# Patient Record
Sex: Female | Born: 1975
Health system: Southern US, Community
[De-identification: ages and names within clinical notes are randomized; demographics above are authoritative.]

## PROBLEM LIST (undated history)

## (undated) DIAGNOSIS — B373 Candidiasis of vulva and vagina: Secondary | ICD-10-CM

## (undated) DIAGNOSIS — N760 Acute vaginitis: Secondary | ICD-10-CM

## (undated) DIAGNOSIS — J302 Other seasonal allergic rhinitis: Secondary | ICD-10-CM

## (undated) DIAGNOSIS — A599 Trichomoniasis, unspecified: Secondary | ICD-10-CM

## (undated) DIAGNOSIS — B3731 Acute candidiasis of vulva and vagina: Secondary | ICD-10-CM

## (undated) DIAGNOSIS — R87810 Cervical high risk human papillomavirus (HPV) DNA test positive: Secondary | ICD-10-CM

## (undated) DIAGNOSIS — L709 Acne, unspecified: Secondary | ICD-10-CM

## (undated) DIAGNOSIS — L659 Nonscarring hair loss, unspecified: Secondary | ICD-10-CM

## (undated) DIAGNOSIS — B9689 Other specified bacterial agents as the cause of diseases classified elsewhere: Secondary | ICD-10-CM

## (undated) DIAGNOSIS — A6000 Herpesviral infection of urogenital system, unspecified: Secondary | ICD-10-CM

## (undated) DIAGNOSIS — Z9289 Personal history of other medical treatment: Secondary | ICD-10-CM

## (undated) HISTORY — DX: Acute candidiasis of vulva and vagina: B37.31

## (undated) HISTORY — DX: Other specified bacterial agents as the cause of diseases classified elsewhere: N76.0

## (undated) HISTORY — DX: Herpesviral infection of urogenital system, unspecified: A60.00

## (undated) HISTORY — DX: Other specified bacterial agents as the cause of diseases classified elsewhere: B96.89

## (undated) HISTORY — DX: Acne, unspecified: L70.9

## (undated) HISTORY — DX: Nonscarring hair loss, unspecified: L65.9

## (undated) HISTORY — DX: Trichomoniasis, unspecified: A59.9

## (undated) HISTORY — DX: Other seasonal allergic rhinitis: J30.2

## (undated) HISTORY — DX: Candidiasis of vulva and vagina: B37.3

## (undated) HISTORY — DX: Personal history of other medical treatment: Z92.89

## (undated) HISTORY — DX: Cervical high risk human papillomavirus (HPV) DNA test positive: R87.810

---

## 2003-03-22 ENCOUNTER — Other Ambulatory Visit: Admission: RE | Admit: 2003-03-22 | Discharge: 2003-03-22 | Payer: Self-pay | Admitting: Obstetrics and Gynecology

## 2003-07-09 ENCOUNTER — Other Ambulatory Visit: Admission: RE | Admit: 2003-07-09 | Discharge: 2003-07-09 | Payer: Self-pay | Admitting: Obstetrics and Gynecology

## 2003-09-26 ENCOUNTER — Inpatient Hospital Stay (HOSPITAL_COMMUNITY): Admission: AD | Admit: 2003-09-26 | Discharge: 2003-09-26 | Payer: Self-pay | Admitting: Pediatrics

## 2003-10-10 ENCOUNTER — Inpatient Hospital Stay (HOSPITAL_COMMUNITY): Admission: AD | Admit: 2003-10-10 | Discharge: 2003-10-14 | Payer: Self-pay | Admitting: Obstetrics and Gynecology

## 2003-11-28 ENCOUNTER — Other Ambulatory Visit: Admission: RE | Admit: 2003-11-28 | Discharge: 2003-11-28 | Payer: Self-pay | Admitting: Obstetrics and Gynecology

## 2007-07-01 ENCOUNTER — Emergency Department: Payer: Self-pay | Admitting: Emergency Medicine

## 2010-10-27 DIAGNOSIS — Z9289 Personal history of other medical treatment: Secondary | ICD-10-CM

## 2010-10-27 HISTORY — DX: Personal history of other medical treatment: Z92.89

## 2013-12-21 LAB — HM PAP SMEAR: HM PAP: NEGATIVE

## 2014-02-24 ENCOUNTER — Ambulatory Visit: Payer: Self-pay | Admitting: Family Medicine

## 2014-02-24 LAB — URINALYSIS, COMPLETE
BACTERIA: NEGATIVE
BILIRUBIN, UR: NEGATIVE
GLUCOSE, UR: NEGATIVE
Ketone: NEGATIVE
Leukocyte Esterase: NEGATIVE
Nitrite: NEGATIVE
PH: 7.5 (ref 5.0–8.0)
Protein: NEGATIVE
Specific Gravity: 1.015 (ref 1.000–1.030)
WBC UR: NONE SEEN /HPF (ref 0–5)

## 2014-02-24 LAB — PREGNANCY, URINE: Pregnancy Test, Urine: NEGATIVE m[IU]/mL

## 2015-03-13 DIAGNOSIS — M7522 Bicipital tendinitis, left shoulder: Secondary | ICD-10-CM | POA: Insufficient documentation

## 2015-03-13 DIAGNOSIS — M7521 Bicipital tendinitis, right shoulder: Secondary | ICD-10-CM | POA: Insufficient documentation

## 2015-03-13 DIAGNOSIS — M7581 Other shoulder lesions, right shoulder: Secondary | ICD-10-CM | POA: Insufficient documentation

## 2015-08-21 DIAGNOSIS — M7551 Bursitis of right shoulder: Secondary | ICD-10-CM | POA: Diagnosis not present

## 2015-08-21 DIAGNOSIS — M7521 Bicipital tendinitis, right shoulder: Secondary | ICD-10-CM | POA: Diagnosis not present

## 2015-08-21 DIAGNOSIS — M7582 Other shoulder lesions, left shoulder: Secondary | ICD-10-CM | POA: Diagnosis not present

## 2015-08-21 DIAGNOSIS — M7581 Other shoulder lesions, right shoulder: Secondary | ICD-10-CM | POA: Diagnosis not present

## 2015-09-11 DIAGNOSIS — A6000 Herpesviral infection of urogenital system, unspecified: Secondary | ICD-10-CM | POA: Diagnosis not present

## 2015-09-11 DIAGNOSIS — B9689 Other specified bacterial agents as the cause of diseases classified elsewhere: Secondary | ICD-10-CM | POA: Diagnosis not present

## 2015-09-11 DIAGNOSIS — N760 Acute vaginitis: Secondary | ICD-10-CM | POA: Diagnosis not present

## 2016-03-02 DIAGNOSIS — R87810 Cervical high risk human papillomavirus (HPV) DNA test positive: Secondary | ICD-10-CM

## 2016-03-02 HISTORY — DX: Cervical high risk human papillomavirus (HPV) DNA test positive: R87.810

## 2016-07-08 ENCOUNTER — Encounter: Payer: Self-pay | Admitting: Obstetrics and Gynecology

## 2016-07-08 ENCOUNTER — Ambulatory Visit (INDEPENDENT_AMBULATORY_CARE_PROVIDER_SITE_OTHER): Payer: BLUE CROSS/BLUE SHIELD | Admitting: Obstetrics and Gynecology

## 2016-07-08 VITALS — BP 110/72 | Ht 66.5 in | Wt 188.0 lb

## 2016-07-08 DIAGNOSIS — Z113 Encounter for screening for infections with a predominantly sexual mode of transmission: Secondary | ICD-10-CM | POA: Diagnosis not present

## 2016-07-08 DIAGNOSIS — Z0000 Encounter for general adult medical examination without abnormal findings: Secondary | ICD-10-CM

## 2016-07-08 DIAGNOSIS — Z1321 Encounter for screening for nutritional disorder: Secondary | ICD-10-CM

## 2016-07-08 DIAGNOSIS — Z13220 Encounter for screening for lipoid disorders: Secondary | ICD-10-CM

## 2016-07-08 DIAGNOSIS — A6004 Herpesviral vulvovaginitis: Secondary | ICD-10-CM | POA: Diagnosis not present

## 2016-07-08 DIAGNOSIS — L659 Nonscarring hair loss, unspecified: Secondary | ICD-10-CM | POA: Diagnosis not present

## 2016-07-08 MED ORDER — VALACYCLOVIR HCL 500 MG PO TABS: 500.0000 mg | ORAL_TABLET | Freq: Two times a day (BID) | ORAL | 0 refills | Status: DC

## 2016-07-08 NOTE — Progress Notes (Addendum)
Chief Complaint  Patient presents with  . Follow-up    Medication Follow Up    HPI:      [name] is a 41 y.o. G3P1011 who LMP was Patient's last menstrual period was 06/12/2016., presents today for full STD testing. She denies any known exposures or sx, but is concerned about being tested. She denies any vaginal sx, d/c, irritation, odor. She has a new sex partner. She would like a gon/chlam swab of her throat too. She has had a recent sore throat with d/c and cough.  She would also like labs for her hair loss disorder. 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 She needs a RF on her valtrex for prn outbreaks.  She is past due for her annul and has it sched 6/18.    Patient Active Problem List   Diagnosis Date Noted  . Genital herpes     Family History  Problem Relation Age of Onset  . Hypertension Mother   . Colon polyps Father   . Liver disease Maternal Grandmother   . Bone cancer Paternal Grandmother   . Lung cancer Paternal Grandfather   . Breast cancer Maternal Aunt     Social History   Social History  . Marital status: Single    Spouse name: N/A  . Number of children: N/A  . Years of education: N/A   Occupational History  . Not on file.   Social History Main Topics  . Smoking status: Never Smoker  . Smokeless tobacco: Never Used  . Alcohol use No  . Drug use: No  . Sexual activity: Not on file   Other Topics Concern  . Not on file   Social History Narrative  . No narrative on file     Current Outpatient Prescriptions:  .  valACYclovir (VALTREX) 500 MG tablet, Take 1 tablet (500 mg total) by mouth 2 (two) times daily., Disp: 30 tablet, Rfl: 0  Review of Systems  Constitutional: Negative for fever.  Gastrointestinal: Negative for blood in stool, constipation, diarrhea, nausea and vomiting.  Genitourinary: Negative for dyspareunia, dysuria, flank pain, frequency, hematuria, urgency, vaginal bleeding, vaginal  discharge and vaginal pain.  Musculoskeletal: Negative for back pain.  Skin: Negative for rash.     OBJECTIVE:   Vitals:  BP 110/72   Ht 5' 6.5" (1.689 m)   Wt 188 lb (85.3 kg)   LMP 06/12/2016   BMI 29.89 kg/m   Physical Exam  Constitutional: She is oriented to person, place, and time and well-developed, well-nourished, and in no distress. Vital signs are normal.  Genitourinary: Vagina normal, uterus normal, cervix normal, right adnexa normal, left adnexa normal and vulva normal. Uterus is not enlarged. Cervix exhibits no motion tenderness and no tenderness. Right adnexum displays no mass and no tenderness. Left adnexum displays no mass and no tenderness. Vulva exhibits no erythema, no exudate, no lesion, no rash and no tenderness. Vagina exhibits no lesion.  Neurological: She is oriented to person, place, and time.  Vitals reviewed.    Assessment/Plan: Screening for STD (sexually transmitted disease) - Full testing per pt request. - Plan: Chlamydia/Gonococcus/Trichomonas, NAA, HIV antibody, RPR, Hepatitis C antibody  Hair loss disorder - Chck yearly labs due to hair transplant a couple yrs ago.  - Plan: TSH + free T4, Testosterone,Free and Total, Ferritin, DHEA, Comprehensive metabolic panel, CANCELED: Comprehensive metabolic panel, CANCELED: Testosterone,Free and Total, CANCELED: Ferritin, CANCELED: TSH + free T4, CANCELED: DHEA  Herpes  simplex vulvovaginitis - Rx RF valtrex eRxd. Pt takes prn sx. - Plan: valACYclovir (VALTREX) 500 MG tablet  Screening cholesterol level - Plan: Lipid panel  Encounter for vitamin deficiency screening - Plan: VITAMIN D 25 Hydroxy (Vit-D Deficiency, Fractures)  Blood tests for routine general physical examination - Plan: TSH + free T4, Comprehensive metabolic panel, Lipid panel, VITAMIN D 25 Hydroxy (Vit-D Deficiency, Fractures)  We don't have test kit for throat gon/chlam swab.    Return if symptoms worsen or fail to improve.  Kyian Obst B.  Nala Kachel, PA-C 07/09/2016 10:21 AM  07/09/16 ADDENDUM: Pt brought throat STD testing kit with her (from her clinic). Pharyngeal swab done for gon/chlam testing. Will f/u with results.  Ekam Besson, PA-C 9/53/20 2:14PM

## 2016-07-09 ENCOUNTER — Other Ambulatory Visit: Payer: Self-pay | Admitting: Obstetrics and Gynecology

## 2016-07-09 ENCOUNTER — Other Ambulatory Visit: Payer: BLUE CROSS/BLUE SHIELD

## 2016-07-09 ENCOUNTER — Other Ambulatory Visit: Payer: Self-pay

## 2016-07-09 DIAGNOSIS — Z113 Encounter for screening for infections with a predominantly sexual mode of transmission: Secondary | ICD-10-CM | POA: Diagnosis not present

## 2016-07-09 DIAGNOSIS — Z0000 Encounter for general adult medical examination without abnormal findings: Secondary | ICD-10-CM | POA: Diagnosis not present

## 2016-07-09 DIAGNOSIS — L659 Nonscarring hair loss, unspecified: Secondary | ICD-10-CM | POA: Diagnosis not present

## 2016-07-09 DIAGNOSIS — Z13220 Encounter for screening for lipoid disorders: Secondary | ICD-10-CM | POA: Diagnosis not present

## 2016-07-09 DIAGNOSIS — Z1321 Encounter for screening for nutritional disorder: Secondary | ICD-10-CM

## 2016-07-09 DIAGNOSIS — J029 Acute pharyngitis, unspecified: Secondary | ICD-10-CM | POA: Diagnosis not present

## 2016-07-09 DIAGNOSIS — A6000 Herpesviral infection of urogenital system, unspecified: Secondary | ICD-10-CM | POA: Insufficient documentation

## 2016-07-11 LAB — RPR: RPR: NONREACTIVE

## 2016-07-11 LAB — COMPREHENSIVE METABOLIC PANEL
A/G RATIO: 1.5 (ref 1.2–2.2)
ALK PHOS: 43 IU/L (ref 39–117)
ALT: 18 IU/L (ref 0–32)
AST: 20 IU/L (ref 0–40)
Albumin: 4.5 g/dL (ref 3.5–5.5)
BUN / CREAT RATIO: 12 (ref 9–23)
BUN: 13 mg/dL (ref 6–24)
Bilirubin Total: 0.5 mg/dL (ref 0.0–1.2)
CALCIUM: 9.5 mg/dL (ref 8.7–10.2)
CO2: 21 mmol/L (ref 18–29)
Chloride: 100 mmol/L (ref 96–106)
Creatinine, Ser: 1.09 mg/dL — ABNORMAL HIGH (ref 0.57–1.00)
GFR calc Af Amer: 73 mL/min/{1.73_m2} (ref >59)
GFR, EST NON AFRICAN AMERICAN: 63 mL/min/{1.73_m2} (ref >59)
GLOBULIN, TOTAL: 3 g/dL (ref 1.5–4.5)
Glucose: 70 mg/dL (ref 65–99)
POTASSIUM: 4.2 mmol/L (ref 3.5–5.2)
SODIUM: 136 mmol/L (ref 134–144)
Total Protein: 7.5 g/dL (ref 6.0–8.5)

## 2016-07-11 LAB — CHLAMYDIA/GONOCOCCUS/TRICHOMONAS, NAA
Chlamydia by NAA: NEGATIVE
Gonococcus by NAA: NEGATIVE
TRICH VAG BY NAA: POSITIVE — AB

## 2016-07-11 LAB — TSH+FREE T4
FREE T4: 1.66 ng/dL (ref 0.82–1.77)
TSH: 1.04 u[IU]/mL (ref 0.450–4.500)

## 2016-07-11 LAB — VITAMIN D 25 HYDROXY (VIT D DEFICIENCY, FRACTURES): Vit D, 25-Hydroxy: 30.3 ng/mL (ref 30.0–100.0)

## 2016-07-11 LAB — DHEA: Dehydroepiandrosterone: 351 ng/dL (ref 31–701)

## 2016-07-11 LAB — LIPID PANEL
CHOL/HDL RATIO: 2.1 ratio (ref 0.0–4.4)
Cholesterol, Total: 154 mg/dL (ref 100–199)
HDL: 75 mg/dL (ref >39)
LDL Calculated: 71 mg/dL (ref 0–99)
TRIGLYCERIDES: 39 mg/dL (ref 0–149)
VLDL Cholesterol Cal: 8 mg/dL (ref 5–40)

## 2016-07-11 LAB — FERRITIN: FERRITIN: 16 ng/mL (ref 15–150)

## 2016-07-11 LAB — TESTOSTERONE,FREE AND TOTAL
TESTOSTERONE: 21 ng/dL (ref 8–48)
Testosterone, Free: 2 pg/mL (ref 0.0–4.2)

## 2016-07-11 LAB — HIV ANTIBODY (ROUTINE TESTING W REFLEX): HIV SCREEN 4TH GENERATION: NONREACTIVE

## 2016-07-11 LAB — HEPATITIS C ANTIBODY

## 2016-07-13 ENCOUNTER — Other Ambulatory Visit: Payer: Self-pay | Admitting: Obstetrics and Gynecology

## 2016-07-13 DIAGNOSIS — A599 Trichomoniasis, unspecified: Secondary | ICD-10-CM

## 2016-07-13 LAB — GC/CHLAMYDIA PROBE AMP
Chlamydia trachomatis, NAA: NEGATIVE
NEISSERIA GONORRHOEAE BY PCR: NEGATIVE

## 2016-07-13 MED ORDER — METRONIDAZOLE 500 MG PO TABS
500.0000 mg | ORAL_TABLET | Freq: Two times a day (BID) | ORAL | 0 refills | Status: AC
Start: 2016-07-13 — End: 2016-07-14

## 2016-07-13 NOTE — Progress Notes (Signed)
Pt aware of trich on nuswab. Rx flagyl eRxd. Partner needs tx. F/u prn.

## 2016-08-19 ENCOUNTER — Encounter: Payer: Self-pay | Admitting: Obstetrics and Gynecology

## 2016-08-19 ENCOUNTER — Ambulatory Visit (INDEPENDENT_AMBULATORY_CARE_PROVIDER_SITE_OTHER): Payer: BLUE CROSS/BLUE SHIELD | Admitting: Obstetrics and Gynecology

## 2016-08-19 VITALS — BP 122/68 | HR 84 | Ht 66.0 in | Wt 182.0 lb

## 2016-08-19 DIAGNOSIS — A6004 Herpesviral vulvovaginitis: Secondary | ICD-10-CM

## 2016-08-19 DIAGNOSIS — Z124 Encounter for screening for malignant neoplasm of cervix: Secondary | ICD-10-CM | POA: Diagnosis not present

## 2016-08-19 DIAGNOSIS — Z01419 Encounter for gynecological examination (general) (routine) without abnormal findings: Secondary | ICD-10-CM

## 2016-08-19 DIAGNOSIS — Z1151 Encounter for screening for human papillomavirus (HPV): Secondary | ICD-10-CM | POA: Diagnosis not present

## 2016-08-19 DIAGNOSIS — Z1231 Encounter for screening mammogram for malignant neoplasm of breast: Secondary | ICD-10-CM | POA: Diagnosis not present

## 2016-08-19 DIAGNOSIS — Z1239 Encounter for other screening for malignant neoplasm of breast: Secondary | ICD-10-CM

## 2016-08-19 NOTE — Progress Notes (Signed)
Chief Complaint  Patient presents with  . Gynecologic Exam     HPI:      [name] is a 41 y.o. G3P1011 who LMP was Patient's last menstrual period was 07/31/2016., presents today for her annual examination.  Her menses are regular every 28-30 days, lasting 4 days.  Dysmenorrhea none. She does not have intermenstrual bleeding.  Sex activity: single partner, contraception - condoms most of the time. She had full STD testing 5/18 that was neg except for trich. Pt was treated. Sx resolved.  Last Pap: December 21, 2013  Results were: no abnormalities  Hx of STDs: chlamydia, HSV, HPV. She takes valtrex prn outbreaks and has recent Rx RF from 5/18 visit. F/u prn.  Last mammogram: not recent There is a FH of breast cancer in her mat aunt, genetic testing not indicated. There is no FH of ovarian cancer. The patient does not do self-breast exams.  Tobacco use: The patient denies current or previous tobacco use. Alcohol use: social drinker Exercise: moderately active  She does get adequate calcium and Vitamin D in her diet.  She had neg labs/lipids 5/18.  Past Medical History:  Diagnosis Date  . Acne   . Bacterial vaginitis   . Bacterial vaginosis   . Candida vaginitis   . Genital herpes   . Genital herpes   . History of Papanicolaou smear of cervix 10/27/2010   -/-/gc neg    Past Surgical History:  Procedure Laterality Date  . CESAREAN SECTION  10/11/2003    Family History  Problem Relation Age of Onset  . Hypertension Mother   . Colon polyps Father [id number]  . Liver disease Maternal Grandmother   . Bone cancer Paternal Grandmother   . Lung cancer Paternal Grandfather   . Breast cancer Maternal Aunt [id number]    Social History   Social History  . Marital status: Single    Spouse name: N/A  . Number of children: N/A  . Years of education: N/A   Occupational History  . Not on file.   Social History Main Topics  . Smoking status: Never Smoker  . Smokeless  tobacco: Never Used  . Alcohol use No  . Drug use: No  . Sexual activity: Yes    Birth control/ protection: Condom   Other Topics Concern  . Not on file   Social History Narrative  . No narrative on file     Current Outpatient Prescriptions:  .  valACYclovir (VALTREX) 500 MG tablet, Take 1 tablet (500 mg total) by mouth 2 (two) times daily., Disp: 30 tablet, Rfl: 0  ROS:  Review of Systems  Constitutional: Negative for fatigue, fever and unexpected weight change.  Respiratory: Negative for cough, shortness of breath and wheezing.   Cardiovascular: Negative for chest pain, palpitations and leg swelling.  Gastrointestinal: Negative for blood in stool, constipation, diarrhea, nausea and vomiting.  Endocrine: Negative for cold intolerance, heat intolerance and polyuria.  Genitourinary: Negative for dyspareunia, dysuria, flank pain, frequency, genital sores, hematuria, menstrual problem, pelvic pain, urgency, vaginal bleeding, vaginal discharge and vaginal pain.  Musculoskeletal: Negative for back pain, joint swelling and myalgias.  Skin: Negative for rash.  Neurological: Negative for dizziness, syncope, light-headedness, numbness and headaches.  Hematological: Negative for adenopathy.  Psychiatric/Behavioral: Negative for agitation, confusion, sleep disturbance and suicidal ideas. The patient is not nervous/anxious.      Objective: BP 122/68 (BP Location: Left Arm, Patient Position: Sitting, Cuff Size: Normal)   Pulse 84  Ht 5\' 6"  (1.676 m)   Wt 182 lb (82.6 kg)   LMP 07/31/2016   BMI 29.38 kg/m    Physical Exam  Constitutional: She is oriented to person, place, and time. She appears well-developed and well-nourished.  Genitourinary: Vagina normal and uterus normal. There is no rash or tenderness on the right labia. There is no rash or tenderness on the left labia. No erythema or tenderness in the vagina. No vaginal discharge found. Right adnexum does not display mass and  does not display tenderness. Left adnexum does not display mass and does not display tenderness. Cervix does not exhibit motion tenderness or polyp. Uterus is not enlarged or tender.  Neck: Normal range of motion. No thyromegaly present.  Cardiovascular: Normal rate, regular rhythm and normal heart sounds.   No murmur heard. Pulmonary/Chest: Effort normal and breath sounds normal. Right breast exhibits no mass, no nipple discharge, no skin change and no tenderness. Left breast exhibits no mass, no nipple discharge, no skin change and no tenderness.  Abdominal: Soft. There is no tenderness. There is no guarding.  Musculoskeletal: Normal range of motion.  Neurological: She is alert and oriented to person, place, and time. No cranial nerve deficit.  Psychiatric: She has a normal mood and affect. Her behavior is normal.  Vitals reviewed.   Assessment/Plan: Encounter for annual routine gynecological examination  Cervical cancer screening - Plan: IGP, Aptima HPV  Screening for HPV (human papillomavirus) - Plan: IGP, Aptima HPV  Screening for breast cancer - Pt to sched mammo. - Plan: MM DIGITAL SCREENING BILATERAL  Herpes simplex vulvovaginitis - Call for Rx RF valtrex prn.             GYN counsel mammography screening, adequate intake of calcium and vitamin D     F/U  Return in about 1 year (around 08/19/2017).  Alicia B. Copland, PA-C 08/19/2016 9:17 AM

## 2016-08-21 LAB — IGP, APTIMA HPV
HPV Aptima: POSITIVE — AB
PAP Smear Comment: 0

## 2016-09-29 DIAGNOSIS — R21 Rash and other nonspecific skin eruption: Secondary | ICD-10-CM | POA: Diagnosis not present

## 2016-10-13 DIAGNOSIS — R21 Rash and other nonspecific skin eruption: Secondary | ICD-10-CM | POA: Diagnosis not present

## 2016-10-23 ENCOUNTER — Other Ambulatory Visit: Payer: Self-pay | Admitting: Obstetrics and Gynecology

## 2016-10-23 DIAGNOSIS — A6004 Herpesviral vulvovaginitis: Secondary | ICD-10-CM

## 2016-12-28 ENCOUNTER — Other Ambulatory Visit: Payer: Self-pay | Admitting: Obstetrics and Gynecology

## 2016-12-28 DIAGNOSIS — A6004 Herpesviral vulvovaginitis: Secondary | ICD-10-CM

## 2017-02-24 ENCOUNTER — Other Ambulatory Visit: Payer: Self-pay | Admitting: Obstetrics and Gynecology

## 2017-02-24 ENCOUNTER — Telehealth: Payer: Self-pay | Admitting: Obstetrics and Gynecology

## 2017-02-24 DIAGNOSIS — A6004 Herpesviral vulvovaginitis: Secondary | ICD-10-CM

## 2017-02-24 NOTE — Telephone Encounter (Signed)
Pt is calling about her prescription not being at her pharmacy. Pt is requesting for at least an 2 month refill on valACYclovir (VALTREX) 500 MG tablet.

## 2017-02-24 NOTE — Telephone Encounter (Signed)
Pt called pharmacy and they had the refill

## 2017-03-09 ENCOUNTER — Ambulatory Visit
Admission: RE | Admit: 2017-03-09 | Discharge: 2017-03-09 | Disposition: A | Discharge status: Home / Self Care | Payer: BLUE CROSS/BLUE SHIELD | Source: Ambulatory Visit | Attending: Obstetrics and Gynecology | Admitting: Obstetrics and Gynecology

## 2017-03-09 DIAGNOSIS — Z1231 Encounter for screening mammogram for malignant neoplasm of breast: Secondary | ICD-10-CM | POA: Insufficient documentation

## 2017-03-09 DIAGNOSIS — Z1239 Encounter for other screening for malignant neoplasm of breast: Secondary | ICD-10-CM

## 2017-03-10 ENCOUNTER — Encounter: Payer: Self-pay | Admitting: Obstetrics and Gynecology

## 2017-04-04 ENCOUNTER — Other Ambulatory Visit: Payer: Self-pay | Admitting: Obstetrics and Gynecology

## 2017-04-04 DIAGNOSIS — A6004 Herpesviral vulvovaginitis: Secondary | ICD-10-CM

## 2017-04-05 ENCOUNTER — Other Ambulatory Visit: Payer: Self-pay

## 2017-04-05 DIAGNOSIS — A6004 Herpesviral vulvovaginitis: Secondary | ICD-10-CM

## 2017-04-05 MED ORDER — VALACYCLOVIR HCL 500 MG PO TABS: 500.0000 mg | ORAL_TABLET | Freq: Every day | ORAL | 0 refills | Status: DC

## 2017-05-06 ENCOUNTER — Other Ambulatory Visit: Payer: Self-pay | Admitting: Obstetrics and Gynecology

## 2017-05-06 DIAGNOSIS — A6004 Herpesviral vulvovaginitis: Secondary | ICD-10-CM

## 2017-05-26 DIAGNOSIS — Z7189 Other specified counseling: Secondary | ICD-10-CM | POA: Diagnosis not present

## 2017-06-09 DIAGNOSIS — H6122 Impacted cerumen, left ear: Secondary | ICD-10-CM | POA: Diagnosis not present

## 2017-10-07 ENCOUNTER — Other Ambulatory Visit: Payer: Self-pay | Admitting: Obstetrics and Gynecology

## 2017-10-07 DIAGNOSIS — A6004 Herpesviral vulvovaginitis: Secondary | ICD-10-CM

## 2017-10-08 ENCOUNTER — Other Ambulatory Visit: Payer: Self-pay

## 2017-10-08 DIAGNOSIS — A6004 Herpesviral vulvovaginitis: Secondary | ICD-10-CM

## 2017-10-08 MED ORDER — VALACYCLOVIR HCL 500 MG PO TABS: 500.0000 mg | ORAL_TABLET | Freq: Every day | ORAL | 0 refills | Status: DC

## 2017-12-07 ENCOUNTER — Other Ambulatory Visit: Payer: Self-pay | Admitting: Obstetrics and Gynecology

## 2017-12-07 DIAGNOSIS — A6004 Herpesviral vulvovaginitis: Secondary | ICD-10-CM

## 2017-12-07 NOTE — Telephone Encounter (Signed)
Please advise 

## 2018-04-08 ENCOUNTER — Other Ambulatory Visit: Payer: Self-pay | Admitting: Obstetrics and Gynecology

## 2018-04-08 DIAGNOSIS — Z1231 Encounter for screening mammogram for malignant neoplasm of breast: Secondary | ICD-10-CM

## 2018-04-20 ENCOUNTER — Encounter: Payer: Self-pay | Admitting: Obstetrics and Gynecology

## 2018-04-20 ENCOUNTER — Other Ambulatory Visit (HOSPITAL_COMMUNITY)
Admission: RE | Admit: 2018-04-20 | Discharge: 2018-04-20 | Disposition: A | Discharge status: Home / Self Care | Payer: Managed Care, Other (non HMO) | Source: Ambulatory Visit | Attending: Obstetrics and Gynecology | Admitting: Obstetrics and Gynecology

## 2018-04-20 ENCOUNTER — Ambulatory Visit (INDEPENDENT_AMBULATORY_CARE_PROVIDER_SITE_OTHER): Payer: Managed Care, Other (non HMO) | Admitting: Obstetrics and Gynecology

## 2018-04-20 VITALS — BP 106/60 | HR 81 | Ht 66.5 in | Wt 191.0 lb

## 2018-04-20 DIAGNOSIS — Z1151 Encounter for screening for human papillomavirus (HPV): Secondary | ICD-10-CM | POA: Insufficient documentation

## 2018-04-20 DIAGNOSIS — Z01419 Encounter for gynecological examination (general) (routine) without abnormal findings: Secondary | ICD-10-CM | POA: Diagnosis not present

## 2018-04-20 DIAGNOSIS — J302 Other seasonal allergic rhinitis: Secondary | ICD-10-CM

## 2018-04-20 DIAGNOSIS — L659 Nonscarring hair loss, unspecified: Secondary | ICD-10-CM

## 2018-04-20 DIAGNOSIS — Z113 Encounter for screening for infections with a predominantly sexual mode of transmission: Secondary | ICD-10-CM | POA: Insufficient documentation

## 2018-04-20 DIAGNOSIS — Z124 Encounter for screening for malignant neoplasm of cervix: Secondary | ICD-10-CM | POA: Diagnosis not present

## 2018-04-20 DIAGNOSIS — A6004 Herpesviral vulvovaginitis: Secondary | ICD-10-CM

## 2018-04-20 DIAGNOSIS — Z1239 Encounter for other screening for malignant neoplasm of breast: Secondary | ICD-10-CM

## 2018-04-20 DIAGNOSIS — R202 Paresthesia of skin: Secondary | ICD-10-CM

## 2018-04-20 MED ORDER — FLUTICASONE PROPIONATE 50 MCG/ACT NA SUSP: NASAL | 3 refills | Status: DC

## 2018-04-20 MED ORDER — VALACYCLOVIR HCL 500 MG PO TABS: ORAL_TABLET | ORAL | 1 refills | Status: DC

## 2018-04-20 NOTE — Progress Notes (Signed)
Chief Complaint  Patient presents with  . Gynecologic Exam    blood work to check hormone levels  . STD screening     HPI:      [name] is a 43 y.o. G3P1011 who LMP was Patient's last menstrual period was 04/13/2018 (approximate)., presents today for her annual examination.  Her menses are regular every 28-30 days, lasting 6 days, light.  Dysmenorrhea none. She does not have intermenstrual bleeding. No vasomotor sx.  Sex activity: sometimes active with single partner, contraception - condoms most of the time. Declines other BC. She had full STD testing 5/18 that was neg except for trich. Pt would like testing again today.  Last Pap: 08/19/16  Results were: no abnormalities /POS HPV DNA. Repeat pap due today. Hx of STDs: chlamydia, HSV, HPV. She takes valtrex prn outbreaks and needs Rx RF.   Last mammogram: 03/09/17 Results: cat 1, repeat in 12 months. Has appt sched. There is a FH of breast cancer in her mat aunt and MGM, genetic testing not indicated. There is no FH of ovarian cancer. The patient does not do self-breast exams.  Tobacco use: The patient denies current or previous tobacco use. Alcohol use: social drinker Exercise: moderately active  She does not get adequate calcium and Vitamin D in her diet.  She had neg labs/lipids 5/18. Needs labs for hair loss disorder. Pt gives to derm.  Pt complains of tingling, heaviness and ache in her bilat lower legs. Sx worse if active all day and ache at night. Pt raises them in bed with some relief. They feel heavy with exercise. Wears compression socks during day when working. She has noted some veins in her legs that are tender to touch.  Past Medical History:  Diagnosis Date  . Acne   . Bacterial vaginitis   . Candida vaginitis   . Cervical high risk human papillomavirus (HPV) DNA test positive 2018  . Genital herpes   . Hair loss disorder   . History of Papanicolaou smear of cervix 10/27/2010   -/-/gc neg  .  Seasonal allergies   . Trichomoniasis     Past Surgical History:  Procedure Laterality Date  . CESAREAN SECTION  10/11/2003    Family History  Problem Relation Age of Onset  . Hypertension Mother   . Cancer Mother        uterine vs cx dysplasia (had hyst)  . Colon polyps Father 79       tested and polyps were removed  . Liver disease Maternal Grandmother   . Breast cancer Maternal Grandmother        40s  . Bone cancer Paternal Grandmother   . Lung cancer Paternal Grandfather   . Breast cancer Maternal Aunt 50    Social History   Socioeconomic History  . Marital status: Single    Spouse name: Not on file  . Number of children: Not on file  . Years of education: Not on file  . Highest education level: Not on file  Occupational History  . Not on file  Social Needs  . Financial resource strain: Not on file  . Food insecurity:    Worry: Not on file    Inability: Not on file  . Transportation needs:    Medical: Not on file    Non-medical: Not on file  Tobacco Use  . Smoking status: Never Smoker  . Smokeless tobacco: Never Used  Substance and Sexual Activity  . Alcohol use: No  .  Drug use: No  . Sexual activity: Not Currently    Birth control/protection: Condom  Lifestyle  . Physical activity:    Days per week: Not on file    Minutes per session: Not on file  . Stress: Not on file  Relationships  . Social connections:    Talks on phone: Not on file    Gets together: Not on file    Attends religious service: Not on file    Active member of club or organization: Not on file    Attends meetings of clubs or organizations: Not on file    Relationship status: Not on file  . Intimate partner violence:    Fear of current or ex partner: Not on file    Emotionally abused: Not on file    Physically abused: Not on file    Forced sexual activity: Not on file  Other Topics Concern  . Not on file  Social History Narrative  . Not on file     Current Outpatient  Medications:  .  fluticasone (FLONASE) 50 MCG/ACT nasal spray, SPRAY 2 SPRAYS INTO EACH NOSTRIL EVERY DAY, Disp: 1 g, Rfl: 3 .  valACYclovir (VALTREX) 500 MG tablet, TAKE 1 TABLET (500 MG TOTAL) BY MOUTH 2 (TWO) TIMES DAILY FOR 3 DAYS AS NEEDED FOR SYMPTOMS, Disp: 30 tablet, Rfl: 1  ROS:  Review of Systems  Constitutional: Negative for fatigue, fever and unexpected weight change.  Respiratory: Negative for cough, shortness of breath and wheezing.   Cardiovascular: Negative for chest pain, palpitations and leg swelling.  Gastrointestinal: Negative for blood in stool, constipation, diarrhea, nausea and vomiting.  Endocrine: Negative for cold intolerance, heat intolerance and polyuria.  Genitourinary: Negative for dyspareunia, dysuria, flank pain, frequency, genital sores, hematuria, menstrual problem, pelvic pain, urgency, vaginal bleeding, vaginal discharge and vaginal pain.  Musculoskeletal: Negative for back pain, joint swelling and myalgias.  Skin: Negative for rash.  Neurological: Negative for dizziness, syncope, light-headedness, numbness and headaches.  Hematological: Negative for adenopathy.  Psychiatric/Behavioral: Negative for agitation, confusion, sleep disturbance and suicidal ideas. The patient is not nervous/anxious.      Objective: BP 106/60   Pulse 81   Ht 5' 6.5" (1.689 m)   Wt 191 lb (86.6 kg)   LMP 04/13/2018 (Approximate)   BMI 30.37 kg/m    Physical Exam Constitutional:      Appearance: She is well-developed.  Genitourinary:     Vulva, vagina, cervix, uterus, right adnexa and left adnexa normal.     No vulval lesion or tenderness noted.     No vaginal discharge, erythema or tenderness.     No cervical polyp.     Uterus is not enlarged or tender.     No right or left adnexal mass present.     Right adnexa not tender.     Left adnexa not tender.  Neck:     Musculoskeletal: Normal range of motion.     Thyroid: No thyromegaly.  Cardiovascular:     Rate  and Rhythm: Normal rate and regular rhythm.     Heart sounds: Normal heart sounds. No murmur.  Pulmonary:     Effort: Pulmonary effort is normal.     Breath sounds: Normal breath sounds.  Chest:     Breasts:        Right: No mass, nipple discharge, skin change or tenderness.        Left: No mass, nipple discharge, skin change or tenderness.  Abdominal:     Palpations:  Abdomen is soft.     Tenderness: There is no abdominal tenderness. There is no guarding.  Musculoskeletal: Normal range of motion.  Neurological:     Mental Status: She is alert and oriented to person, place, and time.     Cranial Nerves: No cranial nerve deficit.  Psychiatric:        Behavior: Behavior normal.  Vitals signs reviewed.     Assessment/Plan: Encounter for annual routine gynecological examination  Cervical cancer screening - Plan: Cytology - PAP  Screening for STD (sexually transmitted disease) - Plan: Cytology - PAP, HIV Antibody (routine testing w rflx), RPR, Hepatitis C antibody  Screening for HPV (human papillomavirus) - Will call with results.  - Plan: Cytology - PAP  Screening for breast cancer - Pt has mammo sched.  Hair loss disorder - Labs today. Will call with results.  - Plan: TSH + free T4, Testosterone,Free and Total, Ferritin, DHEA  Tingling in extremities - Check labs. Most likely degenerative valve/varicose vein issues. F/u with Dr. Jimmie Molly at [facility].  - Plan: B12 and Folate Panel  Herpes simplex vulvovaginitis - Rx RF valtrex eRxd. Pt takes prn sx. - Plan: valACYclovir (VALTREX) 500 MG tablet  Seasonal allergies - Rx RF flonase.  - Plan: fluticasone (FLONASE) 50 MCG/ACT nasal spray  Meds ordered this encounter  Medications  . fluticasone (FLONASE) 50 MCG/ACT nasal spray    Sig: SPRAY 2 SPRAYS INTO EACH NOSTRIL EVERY DAY    Dispense:  1 g    Refill:  3    Order Specific Question:   Supervising Provider    Answer:   Nadara Mustard [id number]  . valACYclovir (VALTREX)  500 MG tablet    Sig: TAKE 1 TABLET (500 MG TOTAL) BY MOUTH 2 (TWO) TIMES DAILY FOR 3 DAYS AS NEEDED FOR SYMPTOMS    Dispense:  30 tablet    Refill:  1    Order Specific Question:   Supervising Provider    Answer:   Nadara Mustard [[id number]]               GYN counsel mammography screening, adequate intake of calcium and vitamin D     F/U  Return in about 1 year (around 04/21/2019).  Luba Matzen B. Cortney Beissel, PA-C 04/20/2018 2:38 PM

## 2018-04-20 NOTE — Patient Instructions (Signed)
I value your feedback and entrusting us with your care. If you get a [facility] patient survey, I would appreciate you taking the time to let us know about your experience today. Thank you! 

## 2018-04-22 LAB — TESTOSTERONE,FREE AND TOTAL
TESTOSTERONE: 10 ng/dL (ref 8–48)
Testosterone, Free: 1.2 pg/mL (ref 0.0–4.2)

## 2018-04-22 LAB — HEPATITIS C ANTIBODY: Hep C Virus Ab: <0.1 s/co ratio (ref 0.0–0.9)

## 2018-04-22 LAB — CYTOLOGY - PAP
CHLAMYDIA, DNA PROBE: NEGATIVE
DIAGNOSIS: NEGATIVE
HPV (WINDOPATH): NOT DETECTED
NEISSERIA GONORRHEA: NEGATIVE
TRICH (WINDOWPATH): NEGATIVE

## 2018-04-22 LAB — RPR: RPR Ser Ql: NONREACTIVE

## 2018-04-22 LAB — HIV ANTIBODY (ROUTINE TESTING W REFLEX): HIV Screen 4th Generation wRfx: NONREACTIVE

## 2018-04-22 LAB — B12 AND FOLATE PANEL
Folate: 11.8 ng/mL (ref >3.0)
Vitamin B-12: 589 pg/mL (ref 232–1245)

## 2018-04-22 LAB — FERRITIN: FERRITIN: 13 ng/mL — AB (ref 15–150)

## 2018-04-22 LAB — DHEA: Dehydroepiandrosterone: 232 ng/dL (ref 31–701)

## 2018-04-22 LAB — TSH+FREE T4
Free T4: 1.27 ng/dL (ref 0.82–1.77)
TSH: 1.59 u[IU]/mL (ref 0.450–4.500)

## 2018-04-23 ENCOUNTER — Encounter: Payer: Self-pay | Admitting: Obstetrics and Gynecology

## 2018-04-28 ENCOUNTER — Other Ambulatory Visit: Payer: Self-pay | Admitting: Obstetrics and Gynecology

## 2018-04-28 ENCOUNTER — Ambulatory Visit
Admission: RE | Admit: 2018-04-28 | Discharge: 2018-04-28 | Disposition: A | Discharge status: Home / Self Care | Payer: Managed Care, Other (non HMO) | Source: Ambulatory Visit | Attending: Obstetrics and Gynecology | Admitting: Obstetrics and Gynecology

## 2018-04-28 DIAGNOSIS — Z1231 Encounter for screening mammogram for malignant neoplasm of breast: Secondary | ICD-10-CM | POA: Diagnosis not present

## 2018-04-28 DIAGNOSIS — N6320 Unspecified lump in the left breast, unspecified quadrant: Secondary | ICD-10-CM

## 2018-04-28 DIAGNOSIS — R928 Other abnormal and inconclusive findings on diagnostic imaging of breast: Secondary | ICD-10-CM

## 2018-04-28 DIAGNOSIS — N6489 Other specified disorders of breast: Secondary | ICD-10-CM

## 2018-05-05 ENCOUNTER — Other Ambulatory Visit: Payer: Managed Care, Other (non HMO)

## 2018-05-05 ENCOUNTER — Ambulatory Visit: Payer: Managed Care, Other (non HMO)

## 2018-05-12 ENCOUNTER — Ambulatory Visit
Admission: RE | Admit: 2018-05-12 | Discharge: 2018-05-12 | Disposition: A | Discharge status: Home / Self Care | Payer: Managed Care, Other (non HMO) | Source: Ambulatory Visit | Attending: Obstetrics and Gynecology | Admitting: Obstetrics and Gynecology

## 2018-05-12 ENCOUNTER — Encounter: Payer: Self-pay | Admitting: Obstetrics and Gynecology

## 2018-05-12 ENCOUNTER — Other Ambulatory Visit: Payer: Self-pay

## 2018-05-12 DIAGNOSIS — N6320 Unspecified lump in the left breast, unspecified quadrant: Secondary | ICD-10-CM

## 2018-05-12 DIAGNOSIS — N6489 Other specified disorders of breast: Secondary | ICD-10-CM

## 2018-05-12 DIAGNOSIS — R928 Other abnormal and inconclusive findings on diagnostic imaging of breast: Secondary | ICD-10-CM

## 2018-08-28 ENCOUNTER — Other Ambulatory Visit: Payer: Self-pay | Admitting: Obstetrics and Gynecology

## 2018-08-28 DIAGNOSIS — A6004 Herpesviral vulvovaginitis: Secondary | ICD-10-CM

## 2018-09-05 ENCOUNTER — Other Ambulatory Visit: Payer: Self-pay | Admitting: Obstetrics and Gynecology

## 2018-09-05 DIAGNOSIS — A6004 Herpesviral vulvovaginitis: Secondary | ICD-10-CM

## 2018-12-27 ENCOUNTER — Encounter: Payer: Self-pay | Admitting: Emergency Medicine

## 2018-12-27 ENCOUNTER — Ambulatory Visit
Admission: EM | Admit: 2018-12-27 | Discharge: 2018-12-27 | Disposition: A | Discharge status: Home / Self Care | Payer: Managed Care, Other (non HMO) | Attending: Family Medicine | Admitting: Family Medicine

## 2018-12-27 ENCOUNTER — Ambulatory Visit
Admission: RE | Admit: 2018-12-27 | Discharge: 2018-12-27 | Disposition: A | Discharge status: Home / Self Care | Payer: Managed Care, Other (non HMO) | Source: Ambulatory Visit | Attending: Urgent Care | Admitting: Urgent Care

## 2018-12-27 ENCOUNTER — Other Ambulatory Visit: Payer: Self-pay

## 2018-12-27 DIAGNOSIS — R1030 Lower abdominal pain, unspecified: Secondary | ICD-10-CM | POA: Diagnosis not present

## 2018-12-27 DIAGNOSIS — K7689 Other specified diseases of liver: Secondary | ICD-10-CM | POA: Insufficient documentation

## 2018-12-27 DIAGNOSIS — R1084 Generalized abdominal pain: Secondary | ICD-10-CM | POA: Diagnosis not present

## 2018-12-27 DIAGNOSIS — Z3202 Encounter for pregnancy test, result negative: Secondary | ICD-10-CM

## 2018-12-27 DIAGNOSIS — M79601 Pain in right arm: Secondary | ICD-10-CM

## 2018-12-27 LAB — URINALYSIS, COMPLETE (UACMP) WITH MICROSCOPIC
Bilirubin Urine: NEGATIVE
Glucose, UA: NEGATIVE mg/dL
Ketones, ur: NEGATIVE mg/dL
Leukocytes,Ua: NEGATIVE
Nitrite: NEGATIVE
Protein, ur: NEGATIVE mg/dL
Specific Gravity, Urine: 1.025 (ref 1.005–1.030)
pH: 6.5 (ref 5.0–8.0)

## 2018-12-27 LAB — PREGNANCY, URINE: Preg Test, Ur: NEGATIVE

## 2018-12-27 MED ORDER — METHYLPREDNISOLONE 4 MG PO TBPK: ORAL_TABLET | ORAL | 0 refills | Status: DC

## 2018-12-27 NOTE — Discharge Instructions (Addendum)
It was very nice seeing you today in clinic. Thank you for entrusting me with your care.   ARM PAIN: take steroids as prescribed. Apply heat and/or ice for comfort. If not improving, follow up with Dr. Roland Rack to discuss further assessment.  ABDOMINAL concerns: will send you for an outpatient ultrasound. Once performed someone will call you with the results.   If your symptoms/condition worsens, please seek follow up care either here or in the ER. Please remember, our [facility] providers are "right here with you" when you need [place].   Again, it was my pleasure to take care of you today. Thank you for choosing our clinic. I hope that you start to feel better quickly.   Honor Loh, MSN, APRN, FNP-C, CEN Advanced Practice Provider [facility]

## 2018-12-27 NOTE — ED Provider Notes (Signed)
[place], [place]   Name: Melanie Griffin DOB: [birth year] MRN: [id number] CSN: [id number] PCP: System, Pcp Not In  Arrival date and time:  12/27/18 [id number]  Chief Complaint:  Arm Pain (right) and Abdominal Pain   NOTE: Prior to seeing the patient today, I have reviewed the triage nursing documentation and vital signs. Clinical staff has updated patient's PMH/PSHx, current medication list, and drug allergies/intolerances to ensure comprehensive history available to assist in medical decision making.   History:   HPI: Melanie Glassingeresa Evette Vondrak is a 43 y.o. female who presents today with complaints of pain in her RIGHT shoulder with radiation into the distal aspect of her extremity. Patient has been intermittent x 1 year, however patient notes that the pain has been worsening over the course of the last year.  Patient denies any injury.  She has been seen in the past by orthopedics Joice Lofts(Poggi, MD) for the same. She was diagnosed with tendonitis in her BILATERAL rotator cuffs and biceps, in addition to bursitis in her RIGHT shoulder. Patient received steroid injections, which she notes improved her symptoms. Patient was ultimately lost to follow up. Despite her recent symptoms, patient has not taken any over the counter interventions to help improve/relieve her reported symptoms at home.   Additionally, patient presents today with concerns related to an intermittent "pulsating" that she appreciates just inferior to her umbilical area. She notes the symptoms started about a week ago. Patient is a Engineer, civil (consulting)nurse. She presents today stating, "I am concerned. I want to make sure that I haven't developed an aortic aneurysm".  Patient denies any nausea, vomiting, on changes to her bowel habits. She has no urinary symptoms. LMP was on 12/13/2018; she is questioning possible pregnancy. She has not experienced any fever or chills. Patient denies any numbness, tingling, or discoloration in her lower extremities.   Past Medical  History:  Diagnosis Date   Acne    Bacterial vaginitis    Candida vaginitis    Cervical high risk human papillomavirus (HPV) DNA test positive 2018   Genital herpes    Hair loss disorder    History of Papanicolaou smear of cervix 10/27/2010   -/-/gc neg   Seasonal allergies    Trichomoniasis     Past Surgical History:  Procedure Laterality Date   CESAREAN SECTION  10/11/2003    Family History  Problem Relation Age of Onset   Hypertension Mother    Cancer Mother        uterine vs cx dysplasia (had hyst)   Colon polyps Father [id number]       tested and polyps were removed   Liver disease Maternal Grandmother    Breast cancer Maternal Grandmother        9460s   Bone cancer Paternal Grandmother    Lung cancer Paternal Grandfather    Breast cancer Maternal Aunt [id number]    Social History   Tobacco Use   Smoking status: Never Smoker   Smokeless tobacco: Never Used  Substance Use Topics   Alcohol use: No   Drug use: No    Patient Active Problem List   Diagnosis Date Noted   Trichomoniasis 07/13/2016   Genital herpes     Home Medications:    No outpatient medications have been marked as taking for the 12/27/18 encounter [facility](Hospital Encounter).    Allergies:   Iodine, Other, and Shellfish allergy  Review of Systems (ROS): Review of Systems  Constitutional: Negative for chills and fever.  Respiratory: Negative for cough and  shortness of breath.   Cardiovascular: Negative for chest pain and palpitations.  Gastrointestinal: Positive for abdominal pain (with associated pulsations). Negative for abdominal distention, blood in stool, constipation, diarrhea, nausea and vomiting.  Genitourinary: Negative for dysuria, flank pain, frequency, hematuria, pelvic pain, urgency and vaginal bleeding.  Musculoskeletal:       Pain in RIGHT shoulder with radiation into arm.   Neurological: Negative for dizziness, syncope, weakness, numbness and headaches.  All other  systems reviewed and are negative.    Vital Signs: Today's Vitals   12/27/18 0819 12/27/18 0820 12/27/18 0821  BP: 121/75    Pulse: 82    Resp: 18    Temp: 99.1 F (37.3 C)    TempSrc: Oral    SpO2: 100%    Weight:   190 lb (86.2 kg)  Height:   5' 6.5" (1.689 m)  PainSc:  3      Physical Exam: Physical Exam  Constitutional: She is oriented to person, place, and time and well-developed, well-nourished, and in no distress. No distress.  HENT:  Head: Normocephalic and atraumatic.  Right Ear: Tympanic membrane normal.  Left Ear: Tympanic membrane normal.  Nose: Nose normal.  Mouth/Throat: Uvula is midline, oropharynx is clear and moist and mucous membranes are normal.  Eyes: Pupils are equal, round, and reactive to light.  Neck: Normal range of motion and full passive range of motion without pain. Neck supple.  Cardiovascular: Normal rate, regular rhythm, normal heart sounds and intact distal pulses. Exam reveals no gallop and no friction rub.  No murmur heard. Pulmonary/Chest: Effort normal. No respiratory distress. She has no decreased breath sounds. She has no wheezes. She has no rhonchi. She has no rales.  Abdominal: Soft. Normal appearance, normal aorta and bowel sounds are normal. She exhibits no distension and no pulsatile midline mass. There is no hepatosplenomegaly. There is no abdominal tenderness. There is no CVA tenderness.    Musculoskeletal: Normal range of motion.     Right shoulder: She exhibits pain (radiation of pain into distal aspect of arm). She exhibits normal range of motion, no tenderness, no swelling, no effusion, no crepitus, no deformity, normal pulse and normal strength.  Neurological: She is alert and oriented to person, place, and time. Gait normal.  Skin: Skin is warm and dry. No rash noted. She is not diaphoretic.  Psychiatric: Mood, memory, affect and judgment normal.  Nursing note and vitals reviewed.   Urgent Care Treatments / Results:    LABS: PLEASE NOTE: all labs that were ordered this encounter are listed, however only abnormal results are displayed. Labs Reviewed  URINALYSIS, COMPLETE (UACMP) WITH MICROSCOPIC  PREGNANCY, URINE    EKG: -None  RADIOLOGY: No results found.  PROCEDURES: Procedures  MEDICATIONS RECEIVED THIS VISIT: Medications - No data to display  PERTINENT CLINICAL COURSE NOTES/UPDATES:   Initial Impression / Assessment and Plan / Urgent Care Course:  Pertinent labs & imaging results that were available during my care of the patient were personally reviewed by me and considered in my medical decision making (see lab/imaging section of note for values and interpretations).  Dorea Duff is a 43 y.o. female who presents to [facility] today with complaints of Arm Pain (right) and Abdominal Pain   Patient is well appearing overall in clinic today. She does not appear to be in any acute distress. Presenting symptoms (see HPI) and exam as documented above. Patient presents with multiple complaints today. Will address as follows:   RIGHT shoulder  pain: pain with (+) radicular symptoms. PMH (+) for tendonitis and bursitis. She has not been taking any medications at home. Patient has been seen by orthopedics Roland Rack, MD) in the past. Will prescribe a Medrol dose pack to help with her symptoms. She was encouraged to apply heat/ice TID-QID for at least 15-20 minutes at a time. If pain not improving, she will need to be seen again by Dr. Roland Rack for ongoing evaluation and treatment.    Abdominal pain/pulsation: patient reporting symptoms x 1 week. She is adamant that something is wrong and is requesting imaging for evaluation of possible AAA. Discussed low likelihood given the fact that she is not having symptoms at this time, coupled with the fact that she has no risk factors. Patient remains adamant. Will send for outpatient ultrasound imaging of the abdomen. Patient will be contacted with the  results of the study and need for further directives as needed.   Discussed follow up with primary care physician in 1 week for re-evaluation. I have reviewed the follow up and strict return precautions for any new or worsening symptoms. Patient is aware of symptoms that would be deemed urgent/emergent, and would thus require further evaluation either here or in the emergency department. At the time of discharge, she verbalized understanding and consent with the discharge plan as it was reviewed with her. All questions were fielded by provider and/or clinic staff prior to patient discharge.    Final Clinical Impressions / Urgent Care Diagnoses:   Final diagnoses:  Right arm pain  Generalized abdominal pain    New Prescriptions:  [place] Controlled Substance Registry consulted? Not Applicable  Meds ordered this encounter  Medications   methylPREDNISolone (MEDROL DOSEPAK) 4 MG TBPK tablet    Sig: Take by mouth daily - taper daily dose per package instructions.    Dispense:  21 tablet    Refill:  0    Recommended Follow up Care:  Patient encouraged to follow up with the following provider within the specified time frame, or sooner as dictated by the severity of her symptoms. As always, she was instructed that for any urgent/emergent care needs, she should seek care either here or in the emergency department for more immediate evaluation.  NOTE: This note was prepared using Lobbyist along with smaller Company secretary. Despite my best ability to proofread, there is the potential that transcriptional errors may still occur from this process, and are completely unintentional.    Karen Kitchens, NP 12/27/18 1125

## 2018-12-27 NOTE — ED Triage Notes (Signed)
Patient in today c/o off & on right shoulder/arm pain x 1 year, worsening in the last week or so.   Patient also c/o a "pulsating" in her lower abdomen x 1 week. Patient's LMP 12/13/18 and doesn't think she is pregnant. Patient states she has urinary frequency, but has been increasing her fluid intake.

## 2019-02-26 ENCOUNTER — Emergency Department
Admission: EM | Admit: 2019-02-26 | Discharge: 2019-02-26 | Disposition: A | Discharge status: Home / Self Care | Payer: Managed Care, Other (non HMO) | Attending: Emergency Medicine | Admitting: Emergency Medicine

## 2019-02-26 ENCOUNTER — Other Ambulatory Visit: Payer: Self-pay

## 2019-02-26 ENCOUNTER — Encounter: Payer: Self-pay | Admitting: Emergency Medicine

## 2019-02-26 DIAGNOSIS — Z20828 Contact with and (suspected) exposure to other viral communicable diseases: Secondary | ICD-10-CM | POA: Insufficient documentation

## 2019-02-26 DIAGNOSIS — R070 Pain in throat: Secondary | ICD-10-CM | POA: Diagnosis not present

## 2019-02-26 DIAGNOSIS — Z20822 Contact with and (suspected) exposure to covid-19: Secondary | ICD-10-CM

## 2019-02-26 DIAGNOSIS — R0981 Nasal congestion: Secondary | ICD-10-CM | POA: Diagnosis present

## 2019-02-26 NOTE — ED Provider Notes (Signed)
[facility] Emergency Department Provider Note  ____________________________________________  Time seen: Approximately 7:56 PM  I have reviewed the triage vital signs and the nursing notes.   HISTORY  Chief Complaint Sore Throat    HPI Melanie Griffin is a 43 y.o. female who presents the emergency department concern for Covid infection.  Patient states that she has had nasal congestion, sore throat x4 days.  She does have allergies but states that the symptoms have been increased from her normal baseline.  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  Patient is concerned that she may have Covid and is scheduled to take a traveling assignment in 2 days in a high risk area.  Patient is requesting Covid testing at this time.  She denies any headache, visual changes, neck pain or stiffness, chest pain, shortness of breath, Donnell pain, nausea vomiting, diarrhea or constipation.         Past Medical History:  Diagnosis Date  . Acne   . Bacterial vaginitis   . Candida vaginitis   . Cervical high risk human papillomavirus (HPV) DNA test positive 2018  . Genital herpes   . Hair loss disorder   . History of Papanicolaou smear of cervix 10/27/2010   -/-/gc neg  . Seasonal allergies   . Trichomoniasis     Patient Active Problem List   Diagnosis Date Noted  . Trichomoniasis 07/13/2016  . Genital herpes     Past Surgical History:  Procedure Laterality Date  . CESAREAN SECTION  10/11/2003    Prior to Admission medications   Medication Sig Start Date End Date Taking? Authorizing Provider  methylPREDNISolone (MEDROL DOSEPAK) 4 MG TBPK tablet Take by mouth daily - taper daily dose per package instructions. 12/27/18   Verlee Monte, NP  fluticasone [facility]ana) 50 MCG/ACT nasal spray SPRAY 2 SPRAYS INTO EACH NOSTRIL EVERY DAY 04/20/18 12/27/18  Copland, Helmut Muster B, PA-C    Allergies Iodine, Other, and Shellfish  allergy  Family History  Problem Relation Age of Onset  . Hypertension Mother   . Cancer Mother        uterine vs cx dysplasia (had hyst)  . Colon polyps Father 39       tested and polyps were removed  . Liver disease Maternal Grandmother   . Breast cancer Maternal Grandmother        51s  . Bone cancer Paternal Grandmother   . Lung cancer Paternal Grandfather   . Breast cancer Maternal Aunt 81    Social History Social History   Tobacco Use  . Smoking status: Never Smoker  . Smokeless tobacco: Never Used  Substance Use Topics  . Alcohol use: No  . Drug use: No     Review of Systems  Constitutional: No fever/chills Eyes: No visual changes. No discharge ENT: Increased nasal congestion and sore throat Cardiovascular: no chest pain. Respiratory: no cough. No SOB. Gastrointestinal: No abdominal pain.  No nausea, no vomiting.  Musculoskeletal: Negative for musculoskeletal pain. Skin: Negative for rash, abrasions, lacerations, ecchymosis. Neurological: Negative for headaches, focal weakness or numbness. 10-point ROS otherwise negative.  ____________________________________________   PHYSICAL EXAM:  VITAL SIGNS: ED Triage Vitals  Enc Vitals Group     BP 02/26/19 1835 137/62     Pulse Rate 02/26/19 1835 79     Resp 02/26/19 1835 16     Temp 02/26/19 1835 98.1 F (36.7 C)     Temp Source 02/26/19 1835 Oral  SpO2 02/26/19 1835 100 %     Weight 02/26/19 1833 185 lb (83.9 kg)     Height 02/26/19 1833 5\' 6"  (1.676 m)     Head Circumference --      Peak Flow --      Pain Score 02/26/19 1833 0     Pain Loc --      Pain Edu? --      Excl. in [place]? --      Constitutional: Alert and oriented. Well appearing and in no acute distress. Eyes: Conjunctivae are normal. PERRL. EOMI. Head: Atraumatic. ENT:      Ears:       Nose: No congestion/rhinnorhea.      Mouth/Throat: Mucous membranes are moist.  Neck: No stridor.  Neck is supple full range of  motion Hematological/Lymphatic/Immunilogical: No cervical lymphadenopathy. Cardiovascular: Normal rate, regular rhythm. Normal S1 and S2.  Good peripheral circulation. Respiratory: Normal respiratory effort without tachypnea or retractions. Lungs CTAB. Good air entry to the bases with no decreased or absent breath sounds. Musculoskeletal: Full range of motion to all extremities. No gross deformities appreciated. Neurologic:  Normal speech and language. No gross focal neurologic deficits are appreciated.  Skin:  Skin is warm, dry and intact. No rash noted. Psychiatric: Mood and affect are normal. Speech and behavior are normal. Patient exhibits appropriate insight and judgement.   ____________________________________________   LABS (all labs ordered are listed, but only abnormal results are displayed)  Labs Reviewed  SARS CORONAVIRUS 2 (TAT 6-24 HRS)   ____________________________________________  EKG   ____________________________________________  RADIOLOGY   No results found.  ____________________________________________    PROCEDURES  Procedure(s) performed:    Procedures    Medications - No data to display   ____________________________________________   INITIAL IMPRESSION / ASSESSMENT AND PLAN / ED COURSE  Pertinent labs & imaging results that were available during my care of the patient were reviewed by me and considered in my medical decision making (see chart for details).  Review of the [place] CSRS was performed in accordance of the [place] prior to dispensing any controlled drugs.           Patient's diagnosis is consistent with encounter for COVID-19 test.  Patient presents emergency department complaining of increased nasal congestion and sore throat following an exposure to COVID-19.  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  Given her increased symptoms even though she does have allergies patient is requesting  Covid swab.  Covid swab will be performed at this time.  No medications at this time.  Follow-up primary care as needed..  Patient is given ED precautions to return to the ED for any worsening or new symptoms.     ____________________________________________  FINAL CLINICAL IMPRESSION(S) / ED DIAGNOSES  Final diagnoses:  Encounter for laboratory testing for COVID-19 virus      NEW MEDICATIONS STARTED DURING THIS VISIT:  ED Discharge Orders    None          This chart was dictated using voice recognition software/Dragon. Despite best efforts to proofread, errors can occur which can change the meaning. Any change was purely unintentional.    Darletta Moll, PA-C 02/26/19 2027    Nance Pear, MD 02/26/19 2107

## 2019-02-26 NOTE — ED Triage Notes (Signed)
First Nurse Note:  Arrives for COVID testing. States one of her patient's tested positive today. Denies any symptom.

## 2019-02-26 NOTE — ED Triage Notes (Signed)
Pt via POV from home. 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 Pt is c/o of sore throat that started Thursday. Pt has no other complaints but wants a COVID test.   Pt A&Ox4 and NAD at this time.

## 2019-02-27 LAB — SARS CORONAVIRUS 2 (TAT 6-24 HRS): SARS Coronavirus 2: NEGATIVE

## 2019-06-26 ENCOUNTER — Other Ambulatory Visit: Payer: Self-pay

## 2019-06-26 ENCOUNTER — Encounter: Payer: Self-pay | Admitting: Emergency Medicine

## 2019-06-26 ENCOUNTER — Emergency Department
Admission: EM | Admit: 2019-06-26 | Discharge: 2019-06-26 | Disposition: A | Discharge status: Home / Self Care | Payer: Managed Care, Other (non HMO) | Attending: Emergency Medicine | Admitting: Emergency Medicine

## 2019-06-26 DIAGNOSIS — R1110 Vomiting, unspecified: Secondary | ICD-10-CM | POA: Diagnosis present

## 2019-06-26 DIAGNOSIS — K529 Noninfective gastroenteritis and colitis, unspecified: Secondary | ICD-10-CM | POA: Insufficient documentation

## 2019-06-26 LAB — URINALYSIS, COMPLETE (UACMP) WITH MICROSCOPIC
Bilirubin Urine: NEGATIVE
Glucose, UA: NEGATIVE mg/dL
Ketones, ur: 80 mg/dL — AB
Leukocytes,Ua: NEGATIVE
Nitrite: NEGATIVE
Specific Gravity, Urine: 1.025 (ref 1.005–1.030)
pH: 7 (ref 5.0–8.0)

## 2019-06-26 LAB — COMPREHENSIVE METABOLIC PANEL
ALT: 19 U/L (ref 0–44)
AST: 25 U/L (ref 15–41)
Albumin: 4.2 g/dL (ref 3.5–5.0)
Alkaline Phosphatase: 44 U/L (ref 38–126)
Anion gap: 6 (ref 5–15)
BUN: 20 mg/dL (ref 6–20)
CO2: 26 mmol/L (ref 22–32)
Calcium: 8.8 mg/dL — ABNORMAL LOW (ref 8.9–10.3)
Chloride: 103 mmol/L (ref 98–111)
Creatinine, Ser: 0.91 mg/dL (ref 0.44–1.00)
GFR calc Af Amer: >60 mL/min (ref >60)
GFR calc non Af Amer: >60 mL/min (ref >60)
Glucose, Bld: 116 mg/dL — ABNORMAL HIGH (ref 70–99)
Potassium: 3.8 mmol/L (ref 3.5–5.1)
Sodium: 135 mmol/L (ref 135–145)
Total Bilirubin: 1 mg/dL (ref 0.3–1.2)
Total Protein: 7.7 g/dL (ref 6.5–8.1)

## 2019-06-26 LAB — CBC
HCT: 38.1 % (ref 36.0–46.0)
Hemoglobin: 12.5 g/dL (ref 12.0–15.0)
MCH: 26.8 pg (ref 26.0–34.0)
MCHC: 32.8 g/dL (ref 30.0–36.0)
MCV: 81.8 fL (ref 80.0–100.0)
Platelets: 235 10*3/uL (ref 150–400)
RBC: 4.66 MIL/uL (ref 3.87–5.11)
RDW: 14.5 % (ref 11.5–15.5)
WBC: 10.4 10*3/uL (ref 4.0–10.5)
nRBC: 0 % (ref 0.0–0.2)

## 2019-06-26 LAB — LIPASE, BLOOD: Lipase: 19 U/L (ref 11–51)

## 2019-06-26 LAB — POCT PREGNANCY, URINE: Preg Test, Ur: NEGATIVE

## 2019-06-26 MED ORDER — ONDANSETRON 4 MG PO TBDP: 4.0000 mg | ORAL_TABLET | Freq: Three times a day (TID) | ORAL | 0 refills | Status: DC | PRN

## 2019-06-26 MED ORDER — ONDANSETRON HCL 4 MG/2ML IJ SOLN
4.0000 mg | Freq: Once | INTRAMUSCULAR | Status: AC | PRN
  Administered 2019-06-26: 4 mg via INTRAVENOUS
  Filled 2019-06-26: qty 2

## 2019-06-26 MED ORDER — SODIUM CHLORIDE 0.9 % IV BOLUS
500.0000 mL | Freq: Once | INTRAVENOUS | Status: AC
  Administered 2019-06-26: 500 mL via INTRAVENOUS

## 2019-06-26 NOTE — ED Provider Notes (Signed)
[facility] Emergency Department Provider Note   ____________________________________________    I have reviewed the triage vital signs and the nursing notes.   HISTORY  Chief Complaint Emesis     HPI Melanie Griffin is a 44 y.o. female who presents with complaints of nausea vomiting and diarrhea.  Patient reports the symptoms started last night at midnight.  She felt a little queasy when she went to bed.  She had barbecue for dinner yesterday.  No sick contacts reported.  No fevers or chills.  Some aching in her back bilaterally.  Has not taken anything for this.  Did receive Zofran in the waiting room which she reports is helped significantly.  No significant abdominal pain.  No recent travel  Past Medical History:  Diagnosis Date  . Acne   . Bacterial vaginitis   . Candida vaginitis   . Cervical high risk human papillomavirus (HPV) DNA test positive 2018  . Genital herpes   . Hair loss disorder   . History of Papanicolaou smear of cervix 10/27/2010   -/-/gc neg  . Seasonal allergies   . Trichomoniasis     Patient Active Problem List   Diagnosis Date Noted  . Trichomoniasis 07/13/2016  . Genital herpes     Past Surgical History:  Procedure Laterality Date  . CESAREAN SECTION  10/11/2003    Prior to Admission medications   Medication Sig Start Date End Date Taking? Authorizing Provider  methylPREDNISolone (MEDROL DOSEPAK) 4 MG TBPK tablet Take by mouth daily - taper daily dose per package instructions. 12/27/18   Verlee Monte, NP  ondansetron (ZOFRAN ODT) 4 MG disintegrating tablet Take 1 tablet (4 mg total) by mouth every 8 (eight) hours as needed. 06/26/19   Jene Every, MD  fluticasone [facility]) 50 MCG/ACT nasal spray SPRAY 2 SPRAYS INTO EACH NOSTRIL EVERY DAY 04/20/18 12/27/18  Copland, Helmut Muster B, PA-C     Allergies Iodine, Other, and Shellfish allergy  Family History  Problem Relation Age of Onset  . Hypertension Mother    . Cancer Mother        uterine vs cx dysplasia (had hyst)  . Colon polyps Father 55       tested and polyps were removed  . Liver disease Maternal Grandmother   . Breast cancer Maternal Grandmother        74s  . Bone cancer Paternal Grandmother   . Lung cancer Paternal Grandfather   . Breast cancer Maternal Aunt 42    Social History Social History   Tobacco Use  . Smoking status: Never Smoker  . Smokeless tobacco: Never Used  Substance Use Topics  . Alcohol use: No  . Drug use: No    Review of Systems  Constitutional: No fever/chills Eyes: No visual changes.  ENT: No sore throat. Cardiovascular:no chest pain. Respiratory: no shortness of breath. Gastrointestinal: As above .   Genitourinary: Negative for dysuria. Musculoskeletal: As above Skin: Negative for rash. Neurological: Negative for headaches or weakness   ____________________________________________   PHYSICAL EXAM:  VITAL SIGNS: ED Triage Vitals  Enc Vitals Group     BP 06/26/19 1253 115/63     Pulse Rate 06/26/19 1253 87     Resp 06/26/19 1253 17     Temp --      Temp src --      SpO2 06/26/19 1253 100 %     Weight 06/26/19 1029 88.5 kg (195 lb)     Height 06/26/19 1029  1.676 m (5\' 6" )     Head Circumference --      Peak Flow --      Pain Score 06/26/19 1028 10     Pain Loc --      Pain Edu? --      Excl. in [place]? --     Constitutional: Alert and oriented.  Eyes: Conjunctivae are normal.   Nose: No congestion/rhinnorhea. Mouth/Throat: Mucous membranes are moist.    Cardiovascular: Normal rate, regular rhythm. Grossly normal heart sounds.  Good peripheral circulation. Respiratory: Normal respiratory effort.  No retractions. Gastrointestinal: Soft and nontender. No distention. .  Musculoskeletal: Warm and well perfused Neurologic:  Normal speech and language. No gross focal neurologic deficits are appreciated.  Skin:  Skin is warm, dry and intact. No rash noted. Psychiatric: Mood and  affect are normal. Speech and behavior are normal.  ____________________________________________   LABS (all labs ordered are listed, but only abnormal results are displayed)  Labs Reviewed  COMPREHENSIVE METABOLIC PANEL - Abnormal; Notable for the following components:      Result Value   Glucose, Bld 116 (*)    Calcium 8.8 (*)    All other components within normal limits  URINALYSIS, COMPLETE (UACMP) WITH MICROSCOPIC - Abnormal; Notable for the following components:   Hgb urine dipstick TRACE (*)    Ketones, ur 80 (*)    Protein, ur TRACE (*)    Bacteria, UA RARE (*)    All other components within normal limits  LIPASE, BLOOD  CBC  POC URINE PREG, ED  POCT PREGNANCY, URINE   ____________________________________________  EKG   ____________________________________________  RADIOLOGY   ____________________________________________   PROCEDURES  Procedure(s) performed: No  Procedures   Critical Care performed: No ____________________________________________   INITIAL IMPRESSION / ASSESSMENT AND PLAN / ED COURSE  Pertinent labs & imaging results that were available during my care of the patient were reviewed by me and considered in my medical decision making (see chart for details).  Patient presents with nausea vomiting diarrhea.  Abdominal exam is quite reassuring.  Differential includes food related illness versus viral gastroenteritis.  Viral gastroenteritis is prevalent in the community this time and I suspect that is the cause of her nausea vomiting diarrhea.  Lab tests are unremarkable, BUN/creatinine ratio is normal.  White blood cell count is normal.  Lipase and LFTs are normal.  Patient treated with p.o. Zofran with significant improvement.  IV fluids also given.  Patient feeling much better after treatment.  Recommend supportive care, outpatient prescription for Zofran provided    ____________________________________________   FINAL CLINICAL  IMPRESSION(S) / ED DIAGNOSES  Final diagnoses:  Gastroenteritis        Note:  This document was prepared using Dragon voice recognition software and may include unintentional dictation errors.   Lavonia Drafts, MD 06/26/19 [id number]

## 2019-06-26 NOTE — ED Notes (Signed)
Topaz signature collector not working at bedside. Pt verbalized understanding of DC, follow up care and prescriptions.

## 2019-06-26 NOTE — ED Triage Notes (Signed)
Here for vomiting since midnight.  No diarrhea. No fevers.  Abdomen sore from vomiting.  Unlabored.

## 2019-07-03 ENCOUNTER — Other Ambulatory Visit: Payer: Self-pay | Admitting: Obstetrics and Gynecology

## 2019-07-03 DIAGNOSIS — Z1231 Encounter for screening mammogram for malignant neoplasm of breast: Secondary | ICD-10-CM

## 2019-07-10 ENCOUNTER — Ambulatory Visit
Admission: EM | Admit: 2019-07-10 | Discharge: 2019-07-10 | Disposition: A | Discharge status: Home / Self Care | Payer: Managed Care, Other (non HMO) | Attending: Family Medicine | Admitting: Family Medicine

## 2019-07-10 ENCOUNTER — Other Ambulatory Visit: Payer: Self-pay

## 2019-07-10 ENCOUNTER — Other Ambulatory Visit: Payer: Self-pay | Admitting: Obstetrics and Gynecology

## 2019-07-10 DIAGNOSIS — L559 Sunburn, unspecified: Secondary | ICD-10-CM | POA: Diagnosis not present

## 2019-07-10 DIAGNOSIS — J302 Other seasonal allergic rhinitis: Secondary | ICD-10-CM

## 2019-07-10 MED ORDER — PREDNISONE 10 MG (21) PO TBPK: ORAL_TABLET | Freq: Every day | ORAL | 0 refills | Status: DC

## 2019-07-10 NOTE — Discharge Instructions (Addendum)
I have sent in a steroid taper for you to take for the next 6 days.  Take as prescribed.   Follow up as needed.

## 2019-07-10 NOTE — ED Triage Notes (Signed)
Patient complains of sun poisioning or sunburn on her upper body. Patient states that she was in [place] and [place]. [place] this weekend. Patient states that she has had this before and needed a steroid pack.

## 2019-07-10 NOTE — ED Provider Notes (Signed)
[place]   [id number] 07/10/19 Arrival Time: 6295  CC: RASH  SUBJECTIVE:  Melanie Griffin is a 44 y.o. female who presents with a skin complaint that began 3 weeks.  Reports excessive sun exposure while on vacation. Denies medications change or starting a new medication recently.  Localizes the rash to arms and shoulders.  Describes it as painful/ itchy/ red.  Has tried after sun lotion with no relief.  Symptoms are made worse with sun exposure.  Reports similar symptoms in the past that improved with steroids.   Denies fever, chills, nausea, vomiting, erythema, swelling, discharge, oral lesions, SOB, chest pain, abdominal pain, changes in bowel or bladder function.    ROS: As per HPI.  All other pertinent ROS negative.     Past Medical History:  Diagnosis Date  . Acne   . Bacterial vaginitis   . Candida vaginitis   . Cervical high risk human papillomavirus (HPV) DNA test positive 2018  . Genital herpes   . Hair loss disorder   . History of Papanicolaou smear of cervix 10/27/2010   -/-/gc neg  . Seasonal allergies   . Trichomoniasis    Past Surgical History:  Procedure Laterality Date  . CESAREAN SECTION  10/11/2003   Allergies  Allergen Reactions  . Iodine   . Other     cashews  . Shellfish Allergy    No current facility-administered medications on file prior to encounter.   Current Outpatient Medications on File Prior to Encounter  Medication Sig Dispense Refill  . methylPREDNISolone (MEDROL DOSEPAK) 4 MG TBPK tablet Take by mouth daily - taper daily dose per package instructions. 21 tablet 0  . ondansetron (ZOFRAN ODT) 4 MG disintegrating tablet Take 1 tablet (4 mg total) by mouth every 8 (eight) hours as needed. 20 tablet 0  . [DISCONTINUED] fluticasone (FLONASE) 50 MCG/ACT nasal spray SPRAY 2 SPRAYS INTO EACH NOSTRIL EVERY DAY 1 g 3   Social History   Socioeconomic History  . Marital status: Single    Spouse name: Not on file  . Number of  children: Not on file  . Years of education: Not on file  . Highest education level: Not on file  Occupational History  . Not on file  Tobacco Use  . Smoking status: Never Smoker  . Smokeless tobacco: Never Used  Substance and Sexual Activity  . Alcohol use: No  . Drug use: No  . Sexual activity: Not Currently    Birth control/protection: Condom  Other Topics Concern  . Not on file  Social History Narrative  . Not on file   Social Determinants of Health   Financial Resource Strain:   . Difficulty of Paying Living Expenses:   Food Insecurity:   . Worried About Charity fundraiser in the Last Year:   . Arboriculturist in the Last Year:   Transportation Needs:   . Film/video editor (Medical):   Marland Kitchen Lack of Transportation (Non-Medical):   Physical Activity:   . Days of Exercise per Week:   . Minutes of Exercise per Session:   Stress:   . Feeling of Stress :   Social Connections:   . Frequency of Communication with Friends and Family:   . Frequency of Social Gatherings with Friends and Family:   . Attends Religious Services:   . Active Member of Clubs or Organizations:   . Attends Archivist Meetings:   Marland Kitchen Marital Status:   Intimate Partner Violence:   .  Fear of Current or Ex-Partner:   . Emotionally Abused:   Marland Kitchen Physically Abused:   . Sexually Abused:    Family History  Problem Relation Age of Onset  . Hypertension Mother   . Cancer Mother        uterine vs cx dysplasia (had hyst)  . Colon polyps Father 74       tested and polyps were removed  . Liver disease Maternal Grandmother   . Breast cancer Maternal Grandmother        50s  . Bone cancer Paternal Grandmother   . Lung cancer Paternal Grandfather   . Breast cancer Maternal Aunt 55    OBJECTIVE: Vitals:   07/10/19 1718 07/10/19 1720  BP:  104/71  Pulse:  85  Resp:  18  Temp:  98.6 F (37 C)  TempSrc:  Oral  SpO2:  97%  Weight: 185 lb (83.9 kg)   Height: 5' 5.5" (1.664 m)     General  appearance: alert; no distress Head: NCAT Lungs: clear to auscultation bilaterally Heart: regular rate and rhythm.  Radial pulse 2+ bilaterally Extremities: no edema Skin: warm and dry;  Psychological: alert and cooperative; normal mood and affect  ASSESSMENT & PLAN:  1. Sunburn     Meds ordered this encounter  Medications  . predniSONE (STERAPRED UNI-PAK 21 TAB) 10 MG (21) TBPK tablet    Sig: Take by mouth daily. Take 6 tabs by mouth daily  for 2 days, then 5 tabs for 2 days, then 4 tabs for 2 days, then 3 tabs for 2 days, 2 tabs for 2 days, then 1 tab by mouth daily for 2 days    Dispense:  42 tablet    Refill:  0    Order Specific Question:   Supervising Provider    Answer:   Merrilee Jansky [[id number]]     Prescribed steroid taper Take as prescribed and to completion Avoid hot showers/ baths Moisturize skin daily  Follow up with PCP if symptoms persists Return or go to the ER if you have any new or worsening symptoms such as fever, chills, nausea, vomiting, redness, swelling, discharge, if symptoms do not improve with medications, etc...  Reviewed expectations re: course of current medical issues. Questions answered. Outlined signs and symptoms indicating need for more acute intervention. Patient verbalized understanding. After Visit Summary given.   Moshe Cipro, NP 07/10/19 1742

## 2019-10-18 DIAGNOSIS — A6004 Herpesviral vulvovaginitis: Secondary | ICD-10-CM | POA: Insufficient documentation

## 2019-10-18 DIAGNOSIS — R87810 Cervical high risk human papillomavirus (HPV) DNA test positive: Secondary | ICD-10-CM | POA: Insufficient documentation

## 2019-10-18 NOTE — Progress Notes (Signed)
Chief Complaint  Patient presents with   Gynecologic Exam    annual exam     HPI:      [name] is a 44 y.o. G3P1011 who LMP was Patient's last menstrual period was 10/12/2019., presents today for her annual examination.  Her menses are regular every 28-30 days, lasting 6 days, light.  Dysmenorrhea none. She does not have intermenstrual bleeding. No vasomotor sx.  Sex activity:  single partner, contraception - condoms most of the time. Declines other BC. She would like full STD testing  Last Pap: 04/20/18  Results were: no abnormalities /neg HPV DNA. POS HPV DNA 2019.  Repeat pap due today. Hx of STDs: chlamydia, HSV, HPV. She takes valtrex prn outbreaks and needs Rx RF.   Last mammogram: 05/12/18  Results: cat 1, repeat in 12 months.  There is a FH of breast cancer in her mat aunt and MGM, genetic testing not indicated. There is no FH of ovarian cancer. The patient  does self-breast exams.  Tobacco use: The patient denies current or previous tobacco use. Alcohol use: none No drug use Exercise: moderately active  She does get adequate calcium and Vitamin D in her diet.  She had neg labs/lipids 5/18. Needs labs for hair loss disorder. Pt gives to derm.  Colonoscopy: never, interested in cologuard  Past Medical History:  Diagnosis Date   Acne    Bacterial vaginitis    Candida vaginitis    Cervical high risk human papillomavirus (HPV) DNA test positive 2018   Genital herpes    Hair loss disorder    History of Papanicolaou smear of cervix 10/27/2010   -/-/gc neg   Seasonal allergies    Trichomoniasis     Past Surgical History:  Procedure Laterality Date   CESAREAN SECTION  10/11/2003    Family History  Problem Relation Age of Onset   Hypertension Mother    Cancer Mother        uterine vs cx dysplasia (had hyst)   Colon polyps Father 49       tested and polyps were removed   Liver disease Maternal Grandmother    Breast cancer  Maternal Grandmother        47s   Bone cancer Paternal Grandmother    Lung cancer Paternal Grandfather    Breast cancer Maternal Aunt 34    Social History   Socioeconomic History   Marital status: Single    Spouse name: Not on file   Number of children: Not on file   Years of education: Not on file   Highest education level: Not on file  Occupational History   Not on file  Tobacco Use   Smoking status: Never Smoker   Smokeless tobacco: Never Used  Vaping Use   Vaping Use: Never used  Substance and Sexual Activity   Alcohol use: No   Drug use: No   Sexual activity: Not Currently    Birth control/protection: Condom  Other Topics Concern   Not on file  Social History Narrative   Not on file   Social Determinants of Health   Financial Resource Strain:    Difficulty of Paying Living Expenses: Not on file  Food Insecurity:    Worried About Programme researcher, broadcasting/film/video in the Last Year: Not on file   [employer] of Food in the Last Year: Not on file  Transportation Needs:    Lack of Transportation (Medical): Not on file   Lack of Transportation (  Non-Medical): Not on file  Physical Activity:    Days of Exercise per Week: Not on file   Minutes of Exercise per Session: Not on file  Stress:    Feeling of Stress : Not on file  Social Connections:    Frequency of Communication with Friends and Family: Not on file   Frequency of Social Gatherings with Friends and Family: Not on file   Attends Religious Services: Not on file   Active Member of Clubs or Organizations: Not on file   Attends Banker Meetings: Not on file   Marital Status: Not on file  Intimate Partner Violence:    Fear of Current or Ex-Partner: Not on file   Emotionally Abused: Not on file   Physically Abused: Not on file   Sexually Abused: Not on file     Current Outpatient Medications:    valACYclovir (VALTREX) 500 MG tablet, Take 1 tablet (500 mg total) by mouth 2  (two) times daily. For 3 days prn sx, Disp: 30 tablet, Rfl: 1  ROS:  Review of Systems  Constitutional: Negative for fatigue, fever and unexpected weight change.  Respiratory: Negative for cough, shortness of breath and wheezing.   Cardiovascular: Negative for chest pain, palpitations and leg swelling.  Gastrointestinal: Negative for blood in stool, constipation, diarrhea, nausea and vomiting.  Endocrine: Negative for cold intolerance, heat intolerance and polyuria.  Genitourinary: Negative for dyspareunia, dysuria, flank pain, frequency, genital sores, hematuria, menstrual problem, pelvic pain, urgency, vaginal bleeding, vaginal discharge and vaginal pain.  Musculoskeletal: Negative for back pain, joint swelling and myalgias.  Skin: Negative for rash.  Neurological: Negative for dizziness, syncope, light-headedness, numbness and headaches.  Hematological: Negative for adenopathy.  Psychiatric/Behavioral: Negative for agitation, confusion, sleep disturbance and suicidal ideas. The patient is not nervous/anxious.      Objective: BP 118/70    Ht 5\' 6"  (1.676 m)    Wt 189 lb 6.4 oz (85.9 kg)    LMP 10/12/2019    BMI 30.57 kg/m    Physical Exam Constitutional:      Appearance: She is well-developed.  Genitourinary:     Vulva, vagina, cervix, uterus, right adnexa and left adnexa normal.     No vulval lesion or tenderness noted.     No vaginal discharge, erythema or tenderness.     No cervical polyp.     Uterus is not enlarged or tender.     No right or left adnexal mass present.     Right adnexa not tender.     Left adnexa not tender.  Neck:     Thyroid: No thyromegaly.  Cardiovascular:     Rate and Rhythm: Normal rate and regular rhythm.     Heart sounds: Normal heart sounds. No murmur heard.   Pulmonary:     Effort: Pulmonary effort is normal.     Breath sounds: Normal breath sounds.  Chest:     Breasts:        Right: No mass, nipple discharge, skin change or tenderness.          Left: No mass, nipple discharge, skin change or tenderness.  Abdominal:     Palpations: Abdomen is soft.     Tenderness: There is no abdominal tenderness. There is no guarding.  Musculoskeletal:        General: Normal range of motion.     Cervical back: Normal range of motion.  Neurological:     General: No focal deficit present.  Mental Status: She is alert and oriented to person, place, and time.     Cranial Nerves: No cranial nerve deficit.  Skin:    General: Skin is warm and dry.  Psychiatric:        Mood and Affect: Mood normal.        Behavior: Behavior normal.        Thought Content: Thought content normal.        Judgment: Judgment normal.  Vitals reviewed.     Assessment/Plan: Encounter for annual routine gynecological examination  Cervical cancer screening - Plan: Cytology - PAP  Screening for STD (sexually transmitted disease) - Plan: Cytology - PAP, HIV Antibody (routine testing w rflx), RPR, Hepatitis C antibody  Screening for HPV (human papillomavirus) - Plan: Cytology - PAP  Cervical high risk HPV (human papillomavirus) test positive - Plan: Cytology - PAP; repeat pap today  Encounter for screening mammogram for malignant neoplasm of breast - Plan: MM 3D SCREEN BREAST BILATERAL; pt to sched mammo  Herpes simplex vulvovaginitis - Plan: valACYclovir (VALTREX) 500 MG tablet; Rx RF eRxd  Screening for colon cancer - Plan: Cologuard; ref sent.   Hair loss disorder - Plan: TSH + free T4, Testosterone,Free and Total, Ferritin, DHEA; labs for derm. Pt to send to them.  Blood tests for routine general physical examination - Plan: Comprehensive metabolic panel, Lipid panel  Screening cholesterol level - Plan: Lipid panel  Meds ordered this encounter  Medications   valACYclovir (VALTREX) 500 MG tablet    Sig: Take 1 tablet (500 mg total) by mouth 2 (two) times daily. For 3 days prn sx    Dispense:  30 tablet    Refill:  1    Order Specific Question:    Supervising Provider    Answer:   Nadara Mustard [[id number]]               GYN counsel mammography screening, adequate intake of calcium and vitamin D     F/U  Return in about 1 year (around 10/18/2020).  Katria Botts B. Opal Dinning, PA-C 10/19/2019 9:42 AM

## 2019-10-19 ENCOUNTER — Other Ambulatory Visit: Payer: Self-pay

## 2019-10-19 ENCOUNTER — Encounter: Payer: Self-pay | Admitting: Obstetrics and Gynecology

## 2019-10-19 ENCOUNTER — Other Ambulatory Visit (HOSPITAL_COMMUNITY)
Admission: RE | Admit: 2019-10-19 | Discharge: 2019-10-19 | Disposition: A | Discharge status: Home / Self Care | Payer: Managed Care, Other (non HMO) | Source: Ambulatory Visit | Attending: Obstetrics and Gynecology | Admitting: Obstetrics and Gynecology

## 2019-10-19 ENCOUNTER — Ambulatory Visit (INDEPENDENT_AMBULATORY_CARE_PROVIDER_SITE_OTHER): Payer: Managed Care, Other (non HMO) | Admitting: Obstetrics and Gynecology

## 2019-10-19 VITALS — BP 118/70 | Ht 66.0 in | Wt 189.4 lb

## 2019-10-19 DIAGNOSIS — Z13220 Encounter for screening for lipoid disorders: Secondary | ICD-10-CM

## 2019-10-19 DIAGNOSIS — Z01419 Encounter for gynecological examination (general) (routine) without abnormal findings: Secondary | ICD-10-CM | POA: Diagnosis not present

## 2019-10-19 DIAGNOSIS — Z124 Encounter for screening for malignant neoplasm of cervix: Secondary | ICD-10-CM

## 2019-10-19 DIAGNOSIS — Z113 Encounter for screening for infections with a predominantly sexual mode of transmission: Secondary | ICD-10-CM | POA: Insufficient documentation

## 2019-10-19 DIAGNOSIS — Z0000 Encounter for general adult medical examination without abnormal findings: Secondary | ICD-10-CM

## 2019-10-19 DIAGNOSIS — R87810 Cervical high risk human papillomavirus (HPV) DNA test positive: Secondary | ICD-10-CM | POA: Insufficient documentation

## 2019-10-19 DIAGNOSIS — A6004 Herpesviral vulvovaginitis: Secondary | ICD-10-CM

## 2019-10-19 DIAGNOSIS — Z1151 Encounter for screening for human papillomavirus (HPV): Secondary | ICD-10-CM | POA: Insufficient documentation

## 2019-10-19 DIAGNOSIS — L659 Nonscarring hair loss, unspecified: Secondary | ICD-10-CM

## 2019-10-19 DIAGNOSIS — Z1231 Encounter for screening mammogram for malignant neoplasm of breast: Secondary | ICD-10-CM

## 2019-10-19 DIAGNOSIS — M76899 Other specified enthesopathies of unspecified lower limb, excluding foot: Secondary | ICD-10-CM | POA: Insufficient documentation

## 2019-10-19 DIAGNOSIS — Z1211 Encounter for screening for malignant neoplasm of colon: Secondary | ICD-10-CM

## 2019-10-19 MED ORDER — VALACYCLOVIR HCL 500 MG PO TABS: 500.0000 mg | ORAL_TABLET | Freq: Two times a day (BID) | ORAL | 1 refills | Status: DC

## 2019-10-19 NOTE — Patient Instructions (Signed)
I value your feedback and entrusting us with your care. If you get a  patient survey, I would appreciate you taking the time to let us know about your experience today. Thank you! ° °As of February 09, 2019, your lab results will be released to your MyChart immediately, before I even have a chance to see them. Please give me time to review them and contact you if there are any abnormalities. Thank you for your patience.  ° °[facility]: [phone] ° ° ° °

## 2019-10-20 LAB — COMPREHENSIVE METABOLIC PANEL
ALT: 12 IU/L (ref 0–32)
AST: 12 IU/L (ref 0–40)
Albumin/Globulin Ratio: 1.4 (ref 1.2–2.2)
Albumin: 4.1 g/dL (ref 3.8–4.8)
Alkaline Phosphatase: 72 IU/L (ref 48–121)
BUN/Creatinine Ratio: 16 (ref 9–23)
BUN: 13 mg/dL (ref 6–24)
Bilirubin Total: 0.2 mg/dL (ref 0.0–1.2)
CO2: 23 mmol/L (ref 20–29)
Calcium: 9.2 mg/dL (ref 8.7–10.2)
Chloride: 102 mmol/L (ref 96–106)
Creatinine, Ser: 0.83 mg/dL (ref 0.57–1.00)
GFR calc Af Amer: 98 mL/min/{1.73_m2} (ref >59)
GFR calc non Af Amer: 85 mL/min/{1.73_m2} (ref >59)
Globulin, Total: 2.9 g/dL (ref 1.5–4.5)
Glucose: 91 mg/dL (ref 65–99)
Potassium: 4.3 mmol/L (ref 3.5–5.2)
Sodium: 139 mmol/L (ref 134–144)
Total Protein: 7 g/dL (ref 6.0–8.5)

## 2019-10-20 LAB — LIPID PANEL
Chol/HDL Ratio: 2.4 ratio (ref 0.0–4.4)
Cholesterol, Total: 167 mg/dL (ref 100–199)
HDL: 69 mg/dL (ref >39)
LDL Chol Calc (NIH): 88 mg/dL (ref 0–99)
Triglycerides: 45 mg/dL (ref 0–149)
VLDL Cholesterol Cal: 10 mg/dL (ref 5–40)

## 2019-10-23 LAB — DHEA: Dehydroepiandrosterone: 207 ng/dL (ref 31–701)

## 2019-10-23 LAB — RPR: RPR Ser Ql: NONREACTIVE

## 2019-10-23 LAB — FERRITIN: Ferritin: 17 ng/mL (ref 15–150)

## 2019-10-23 LAB — HEPATITIS C ANTIBODY: Hep C Virus Ab: 0.1 s/co ratio (ref 0.0–0.9)

## 2019-10-23 LAB — TSH+FREE T4
Free T4: 1.31 ng/dL (ref 0.82–1.77)
TSH: 0.784 u[IU]/mL (ref 0.450–4.500)

## 2019-10-23 LAB — HIV ANTIBODY (ROUTINE TESTING W REFLEX): HIV Screen 4th Generation wRfx: NONREACTIVE

## 2019-10-23 LAB — TESTOSTERONE,FREE AND TOTAL
Testosterone, Free: 1.3 pg/mL (ref 0.0–4.2)
Testosterone: 16 ng/dL (ref 4–50)

## 2019-10-24 LAB — CYTOLOGY - PAP
Chlamydia: NEGATIVE
Comment: NEGATIVE
Comment: NEGATIVE
Comment: NORMAL
Diagnosis: NEGATIVE
High risk HPV: NEGATIVE
Neisseria Gonorrhea: NEGATIVE

## 2019-10-28 ENCOUNTER — Encounter: Payer: Self-pay | Admitting: Obstetrics and Gynecology

## 2019-10-28 LAB — COLOGUARD

## 2019-11-12 ENCOUNTER — Other Ambulatory Visit: Payer: Self-pay | Admitting: Obstetrics and Gynecology

## 2019-11-12 DIAGNOSIS — A6004 Herpesviral vulvovaginitis: Secondary | ICD-10-CM

## 2019-12-05 ENCOUNTER — Other Ambulatory Visit: Payer: Self-pay

## 2019-12-05 ENCOUNTER — Other Ambulatory Visit: Payer: Self-pay | Admitting: Obstetrics and Gynecology

## 2019-12-05 ENCOUNTER — Ambulatory Visit
Admission: RE | Admit: 2019-12-05 | Discharge: 2019-12-05 | Disposition: A | Discharge status: Home / Self Care | Payer: Managed Care, Other (non HMO) | Source: Ambulatory Visit | Attending: Obstetrics and Gynecology | Admitting: Obstetrics and Gynecology

## 2019-12-05 DIAGNOSIS — Z1231 Encounter for screening mammogram for malignant neoplasm of breast: Secondary | ICD-10-CM | POA: Diagnosis present

## 2019-12-05 DIAGNOSIS — A6004 Herpesviral vulvovaginitis: Secondary | ICD-10-CM

## 2019-12-07 ENCOUNTER — Other Ambulatory Visit: Payer: Self-pay | Admitting: Obstetrics and Gynecology

## 2019-12-07 DIAGNOSIS — R928 Other abnormal and inconclusive findings on diagnostic imaging of breast: Secondary | ICD-10-CM

## 2019-12-07 DIAGNOSIS — R921 Mammographic calcification found on diagnostic imaging of breast: Secondary | ICD-10-CM

## 2019-12-14 ENCOUNTER — Ambulatory Visit: Admission: RE | Admit: 2019-12-14 | Payer: Managed Care, Other (non HMO) | Source: Ambulatory Visit

## 2020-01-17 ENCOUNTER — Other Ambulatory Visit: Payer: Self-pay | Admitting: Obstetrics and Gynecology

## 2020-01-17 DIAGNOSIS — A6004 Herpesviral vulvovaginitis: Secondary | ICD-10-CM

## 2020-02-05 ENCOUNTER — Telehealth: Payer: Self-pay | Admitting: Obstetrics and Gynecology

## 2020-02-05 ENCOUNTER — Encounter: Payer: Self-pay | Admitting: Obstetrics and Gynecology

## 2020-02-05 DIAGNOSIS — R1032 Left lower quadrant pain: Secondary | ICD-10-CM

## 2020-02-05 DIAGNOSIS — K5792 Diverticulitis of intestine, part unspecified, without perforation or abscess without bleeding: Secondary | ICD-10-CM

## 2020-02-05 NOTE — Telephone Encounter (Signed)
Pt aware.

## 2020-02-05 NOTE — Telephone Encounter (Signed)
Pls let pt know GI ref placed.

## 2020-02-05 NOTE — Telephone Encounter (Signed)
Pt was seen in the er last Thursday at [facility], pt requesting referral for colonoscopy per [facility] er.

## 2020-02-05 NOTE — Addendum Note (Signed)
Addended by: Althea Grimmer B on: 02/05/2020 01:35 PM   Modules accepted: Orders

## 2020-02-19 ENCOUNTER — Ambulatory Visit
Admission: RE | Admit: 2020-02-19 | Discharge: 2020-02-19 | Disposition: A | Discharge status: Home / Self Care | Payer: Managed Care, Other (non HMO) | Source: Ambulatory Visit | Attending: Obstetrics and Gynecology | Admitting: Obstetrics and Gynecology

## 2020-02-19 ENCOUNTER — Other Ambulatory Visit: Payer: Self-pay

## 2020-02-19 DIAGNOSIS — R928 Other abnormal and inconclusive findings on diagnostic imaging of breast: Secondary | ICD-10-CM | POA: Insufficient documentation

## 2020-02-19 DIAGNOSIS — R921 Mammographic calcification found on diagnostic imaging of breast: Secondary | ICD-10-CM | POA: Diagnosis present

## 2020-02-20 ENCOUNTER — Other Ambulatory Visit: Payer: Self-pay | Admitting: Obstetrics and Gynecology

## 2020-02-20 DIAGNOSIS — R928 Other abnormal and inconclusive findings on diagnostic imaging of breast: Secondary | ICD-10-CM

## 2020-04-06 IMAGING — MG DIGITAL SCREENING BILATERAL MAMMOGRAM WITH TOMO AND CAD
8 series · 8 of 24 positions shown · non-contrast
Comparison: Previous exam(s).

CLINICAL DATA: Screening.

EXAM:
DIGITAL SCREENING BILATERAL MAMMOGRAM WITH TOMO AND CAD

[R MLO synth-2D]
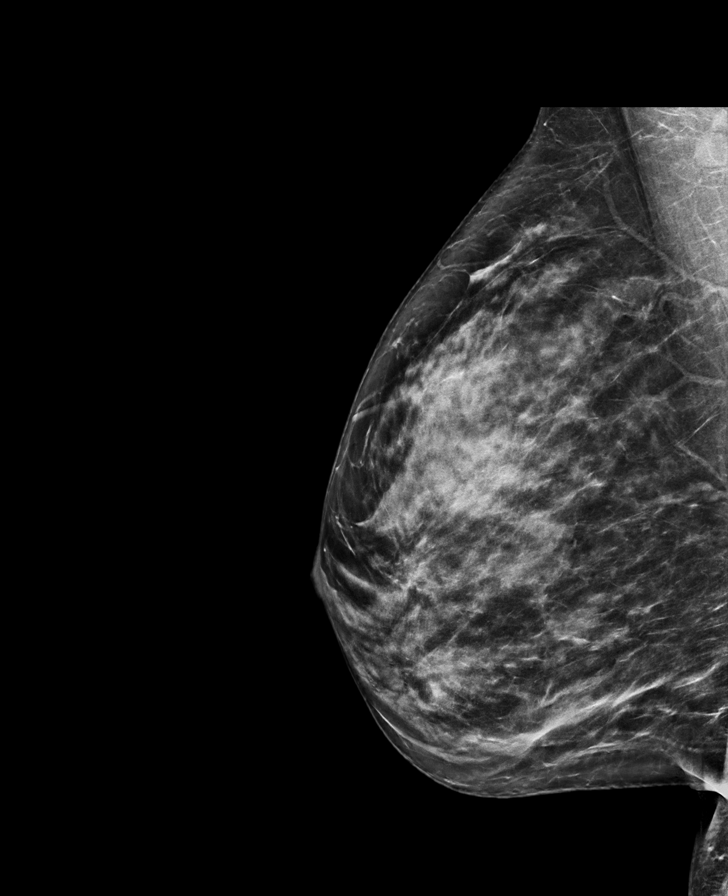

[R CC synth-2D]
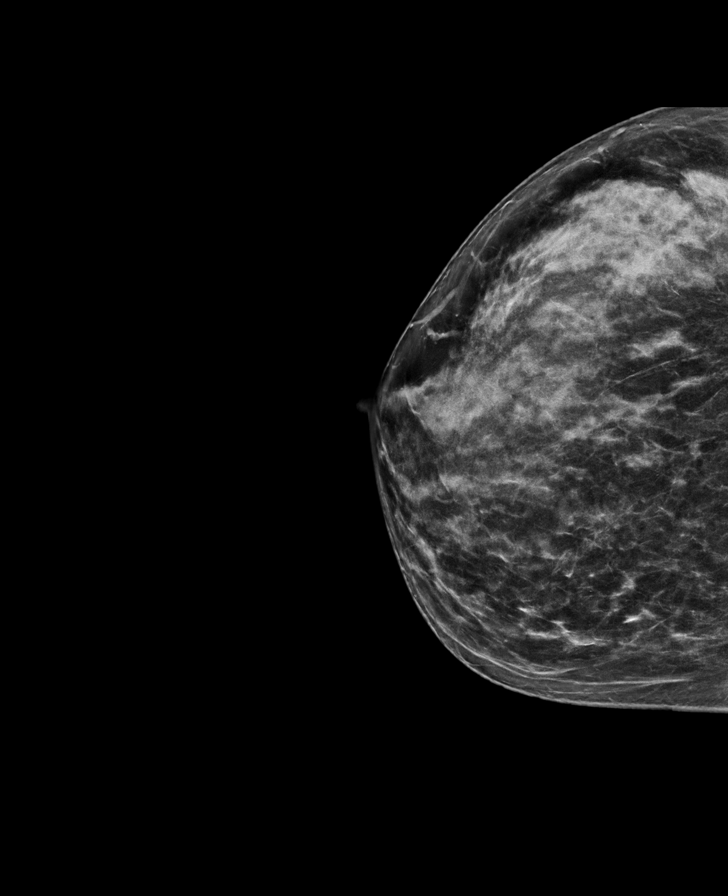

[L CC synth-2D]
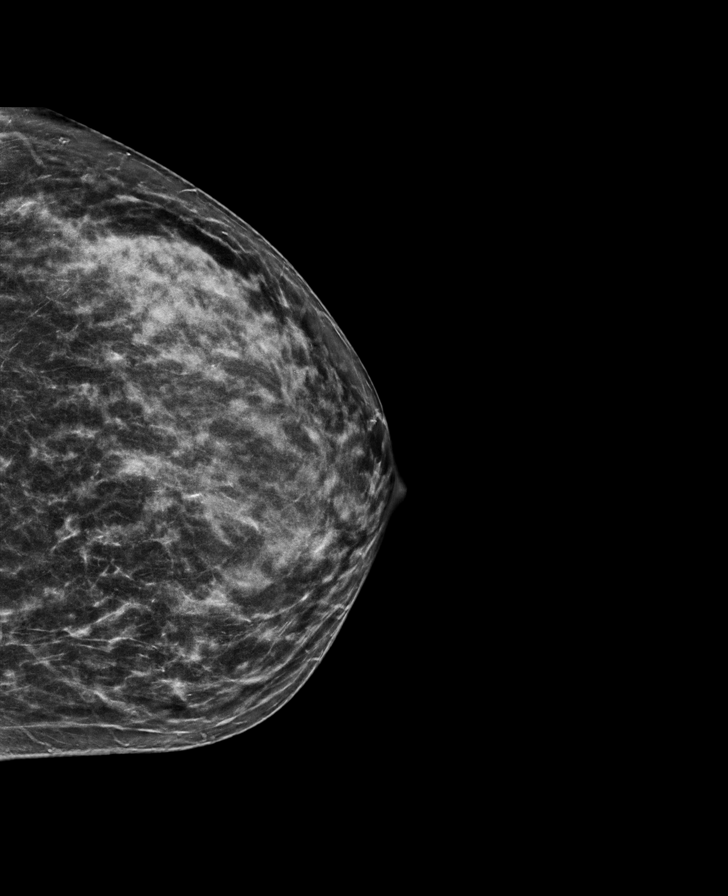

[L MLO synth-2D]
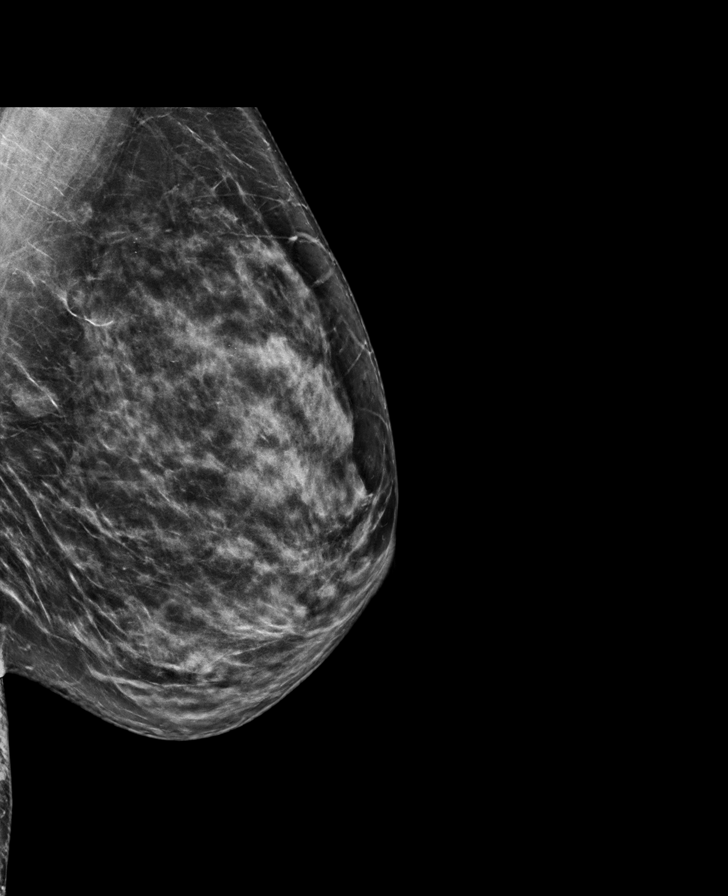

[L CC tomo · tomo slice 33/64.0]
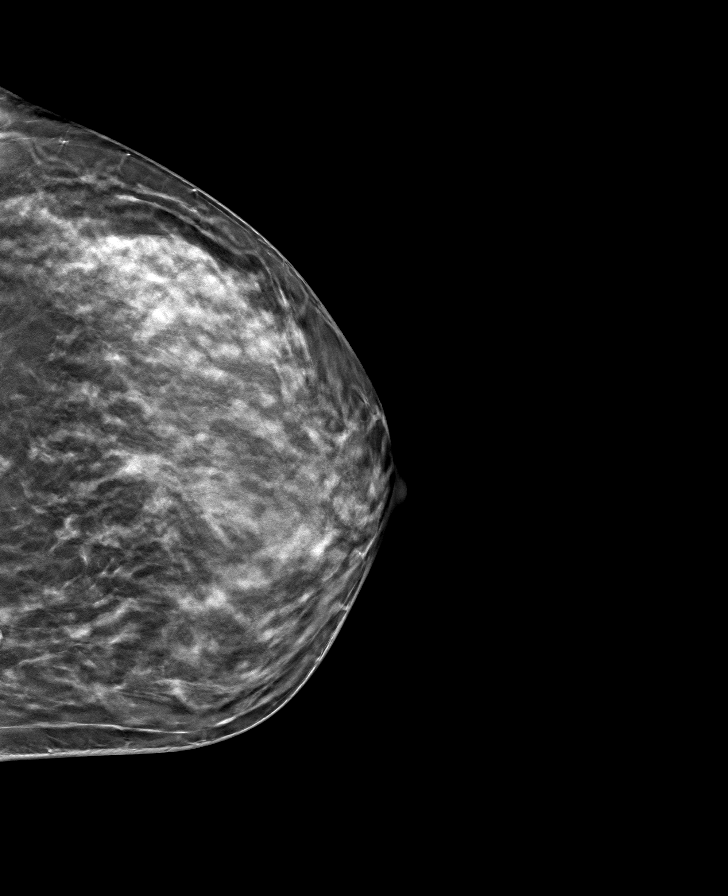

[R CC tomo · tomo slice 35/70.0]
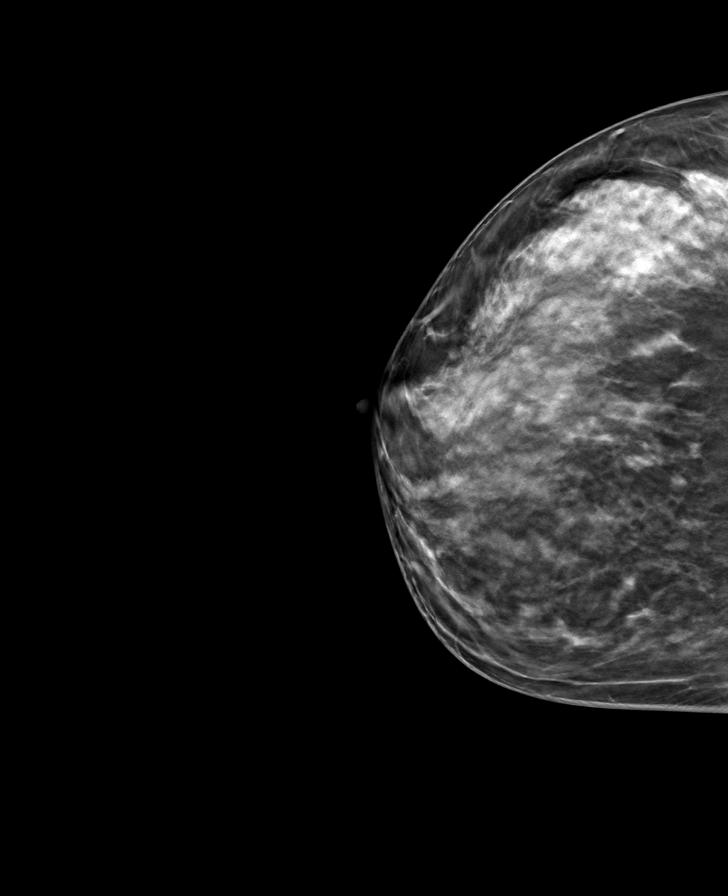

[L MLO tomo · tomo slice 41/80.0]
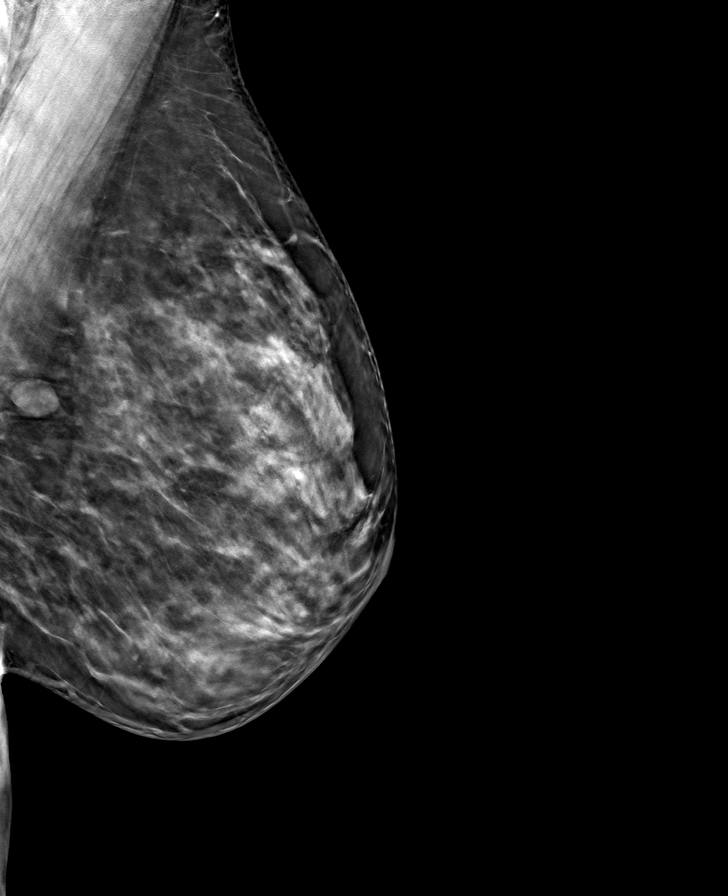

[R MLO tomo · tomo slice 38/75.0]
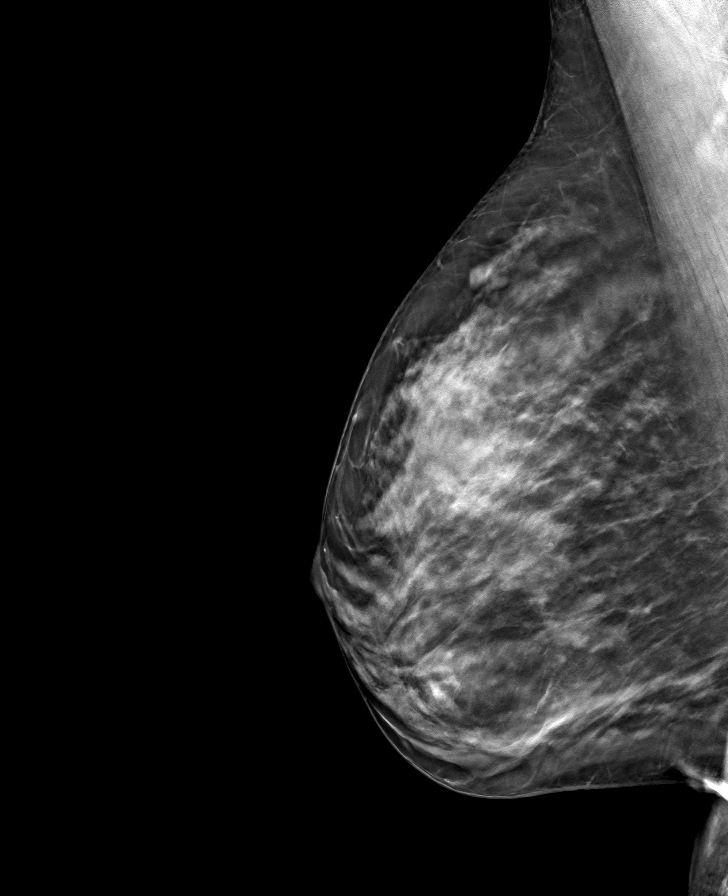

[8 of 24 positions shown; findings below may reference images not displayed]

ACR Breast Density Category c: The breast tissue is heterogeneously
dense, which may obscure small masses.
FINDINGS: In the right breast , a possible asymmetry requires further
evaluation.

In the left breast , a possible mass requires further evaluation.

Images were processed with CAD.
IMPRESSION: Further evaluation is suggested for possible asymmetry in the right
breast.

Further evaluation is suggested for possible mass in the left
breast.

RECOMMENDATION:
Diagnostic mammogram and possibly ultrasound of both breasts.
(Code:DJ-[id number])

The patient will be contacted regarding the findings, and additional
imaging will be scheduled.

BI-RADS CATEGORY  0: Incomplete. Need additional imaging evaluation
and/or prior mammograms for comparison.

## 2020-04-20 IMAGING — US ULTRASOUND LEFT BREAST LIMITED
1 series · 7 of 7 positions shown · non-contrast
Comparison: Previous exam(s).

CLINICAL DATA: 42-year-old female recalled from screening mammogram
dated 04/28/2018 for a possible right breast asymmetry and possible
left breast mass.

EXAM:
DIGITAL DIAGNOSTIC BILATERAL MAMMOGRAM WITH CAD AND TOMO
ULTRASOUND LEFT BREAST

[Series 1: ultrasound left breast limited · 0.06mm/px · 7 of 7 slices shown]
[im 1/7]
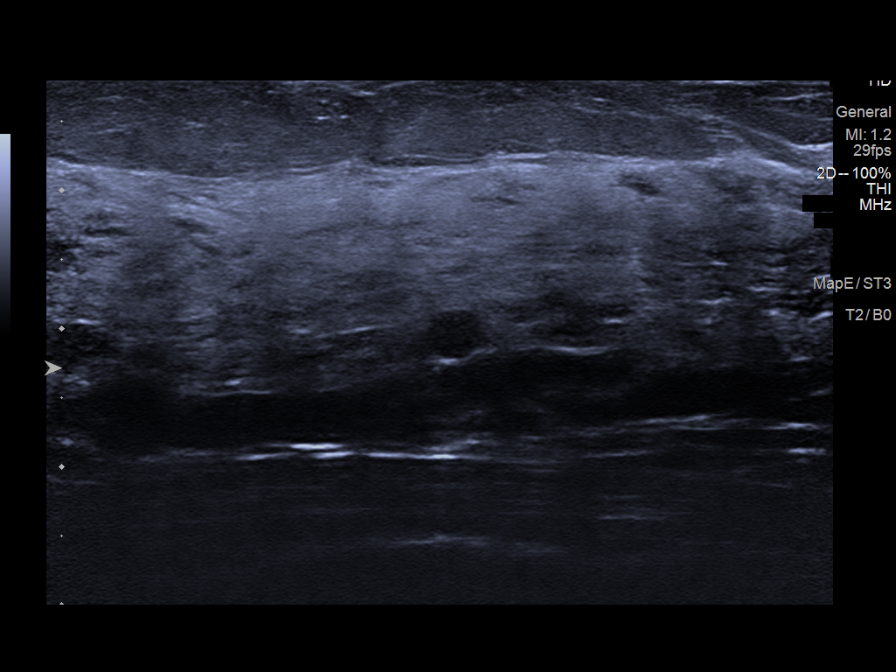
[im 2/7]
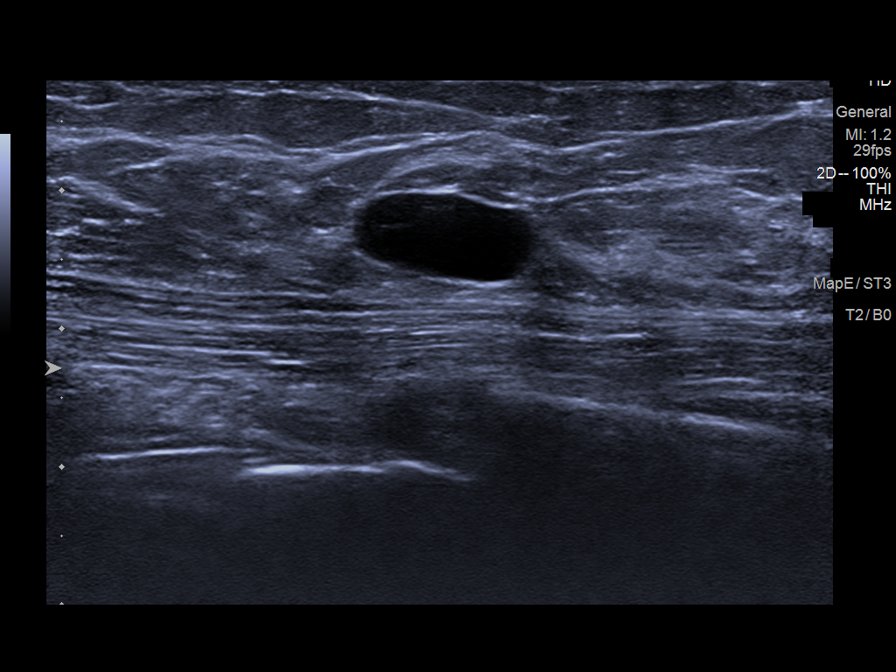
[im 3/7]
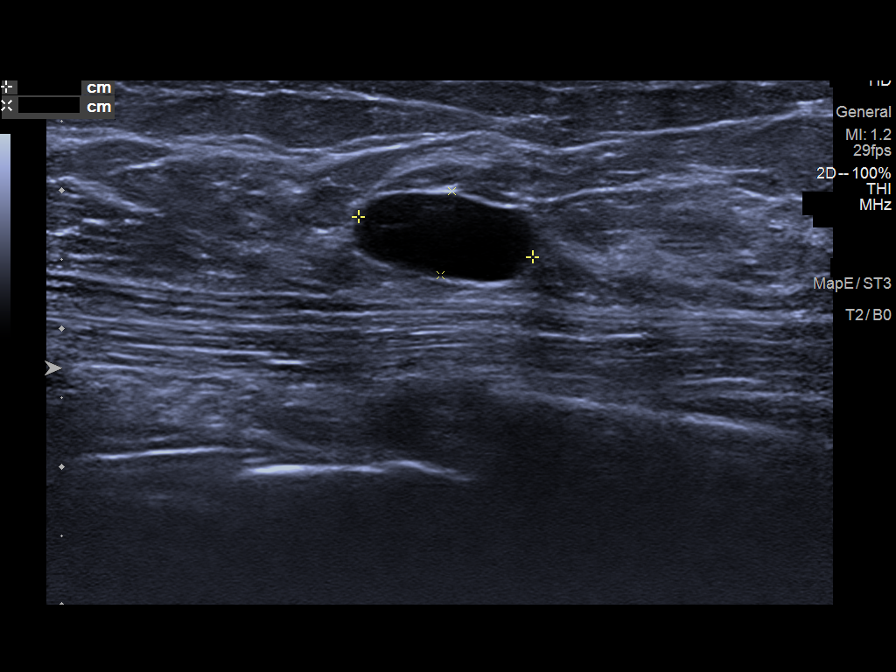
[im 4/7]
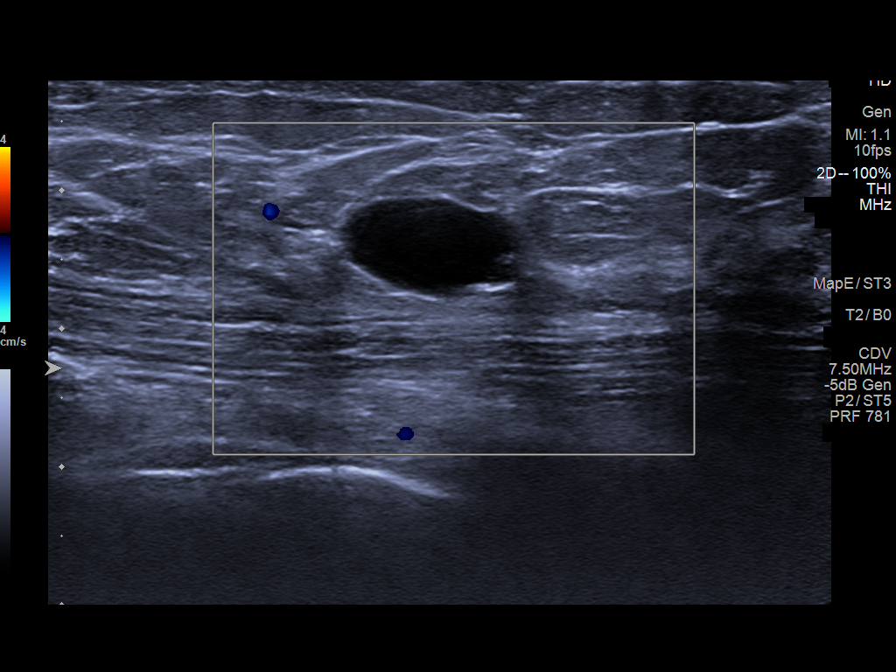
[im 5/7]
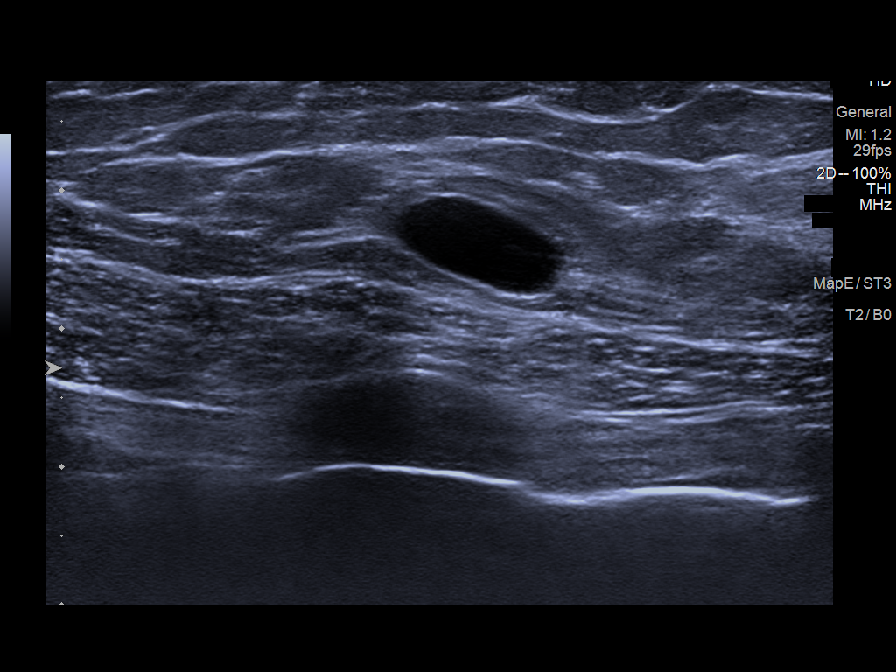
[im 6/7]
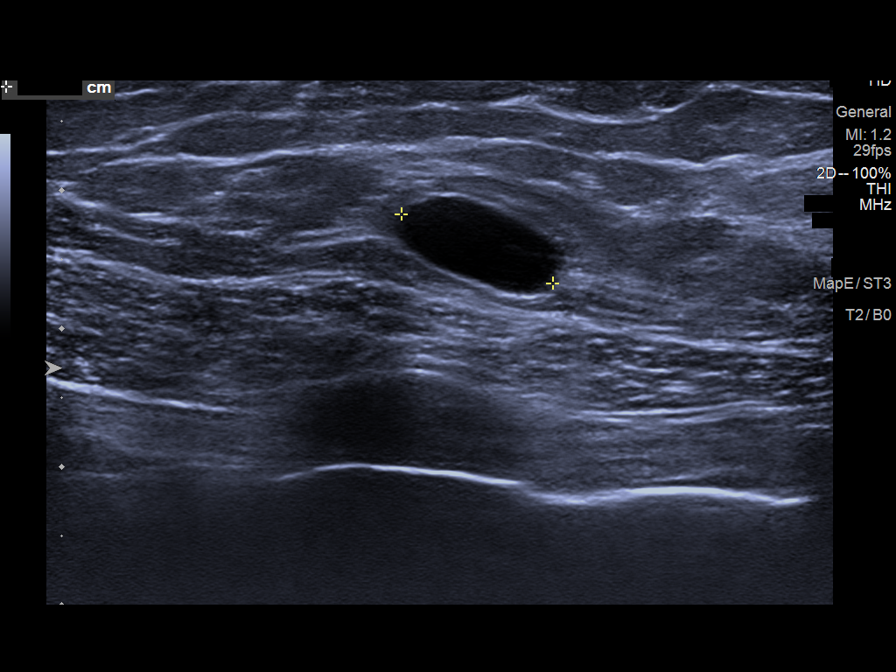
[im 7/7]
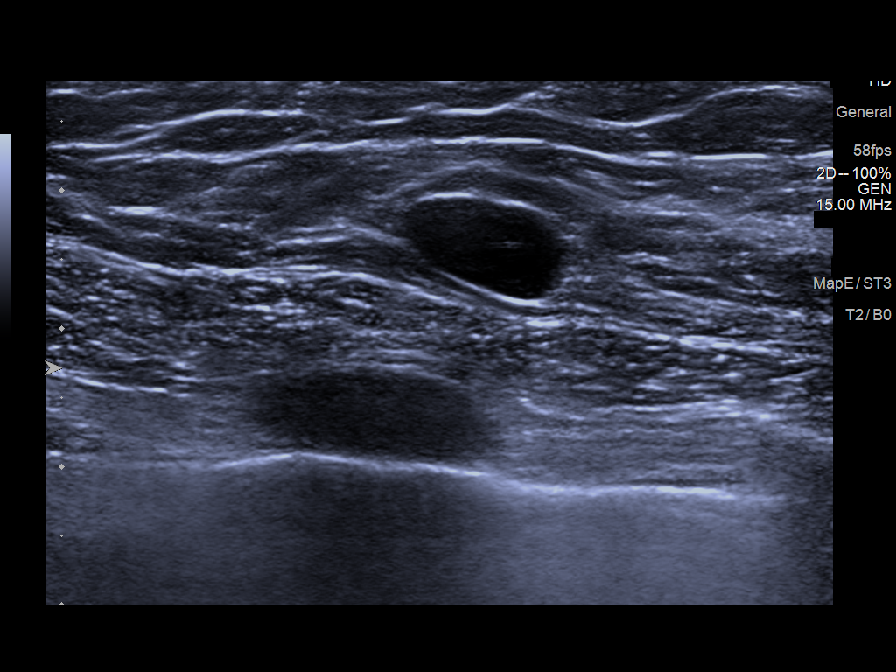

[7 of 7 positions shown; findings below may reference images not displayed]

ACR Breast Density Category c: The breast tissue is heterogeneously
dense, which may obscure small masses.
FINDINGS: Previously described, possible asymmetry in the slightly inferior
right breast at mid to posterior depth on the MLO projection
resolves into fibroglandular tissue on additional spot compression
views.

An oval, circumscribed equal density mass persists in the slightly
superior left breast at far posterior depth. It localizes medially
on tomosynthesis. Further evaluation with ultrasound was performed.

Mammographic images were processed with CAD.

Targeted ultrasound is performed, showing an oval, circumscribed
anechoic mass consistent with a simple cyst with a single thin
internal septation at the 10 o'clock position 8 cm from the nipple.
It measures 1.3 x 1.2 x 0.6 cm. There is no associated calculated a.
This correlates well with the mammographic finding.

Numerous additional simple and minimally complicated cysts were seen
scattered throughout the left breast in real-time scanning.
IMPRESSION: 1. No persistent, suspicious abnormalities in the right breast
corresponding with the screening mammographic finding.
2. Benign simple cyst corresponding with the screening left breast
mammographic finding. No further imaging follow-up required.

RECOMMENDATION:
Screening mammogram in one year.(Code:OM-[id number])

I have discussed the findings and recommendations with the patient.
Results were also provided in writing at the conclusion of the
visit. If applicable, a reminder letter will be sent to the patient
regarding the next appointment.

BI-RADS CATEGORY  2: Benign.

## 2020-06-04 ENCOUNTER — Other Ambulatory Visit: Payer: Self-pay | Admitting: Obstetrics and Gynecology

## 2020-08-08 ENCOUNTER — Encounter: Payer: Self-pay | Admitting: Obstetrics and Gynecology

## 2020-08-08 ENCOUNTER — Ambulatory Visit (INDEPENDENT_AMBULATORY_CARE_PROVIDER_SITE_OTHER): Payer: Managed Care, Other (non HMO) | Admitting: Obstetrics and Gynecology

## 2020-08-08 ENCOUNTER — Other Ambulatory Visit: Payer: Self-pay

## 2020-08-08 ENCOUNTER — Other Ambulatory Visit (HOSPITAL_COMMUNITY)
Admission: RE | Admit: 2020-08-08 | Discharge: 2020-08-08 | Disposition: A | Discharge status: Home / Self Care | Payer: Managed Care, Other (non HMO) | Source: Ambulatory Visit | Attending: Obstetrics and Gynecology | Admitting: Obstetrics and Gynecology

## 2020-08-08 VITALS — BP 110/60 | Ht 66.5 in | Wt 188.0 lb

## 2020-08-08 DIAGNOSIS — B9689 Other specified bacterial agents as the cause of diseases classified elsewhere: Secondary | ICD-10-CM | POA: Diagnosis not present

## 2020-08-08 DIAGNOSIS — Z113 Encounter for screening for infections with a predominantly sexual mode of transmission: Secondary | ICD-10-CM | POA: Diagnosis not present

## 2020-08-08 DIAGNOSIS — N760 Acute vaginitis: Secondary | ICD-10-CM | POA: Diagnosis not present

## 2020-08-08 LAB — POCT WET PREP WITH KOH
Clue Cells Wet Prep HPF POC: POSITIVE
KOH Prep POC: POSITIVE — AB
Trichomonas, UA: NEGATIVE
Yeast Wet Prep HPF POC: NEGATIVE

## 2020-08-08 MED ORDER — METRONIDAZOLE 0.75 % VA GEL: 1.0000 | Freq: Every day | VAGINAL | 2 refills | Status: AC

## 2020-08-08 NOTE — Progress Notes (Signed)
Patient, No Pcp Per (Inactive)   Chief Complaint  Patient presents with   Vaginal Discharge    Fishy odor, no itchiness or irritation x 6 days     HPI:      [name] is a 45 y.o. G3P1011 whose LMP was Patient's last menstrual period was 07/18/2020 (exact date)., presents today for increased vag d/c with odor since earlier this wk. She was recently sex active with new partner and sx developed shortly after. She didn't use condoms and did Plan B the next day. Developed brown d/c and odor about a wk later. D/c and odor improved today. Pt with hx of BV, often times triggered by swimming (and plans to swim this summer). No urin sx, vag itching, unusual LBP, pelvic pain, fevers. Likes to treat with metrogel.   Neg pap/STD testing 8/21  Past Medical History:  Diagnosis Date   Acne    Bacterial vaginitis    Candida vaginitis    Cervical high risk human papillomavirus (HPV) DNA test positive 2018   Genital herpes    Hair loss disorder    History of Papanicolaou smear of cervix 10/27/2010   -/-/gc neg   Seasonal allergies    Trichomoniasis     Past Surgical History:  Procedure Laterality Date   CESAREAN SECTION  10/11/2003    Family History  Problem Relation Age of Onset   Hypertension Mother    Cancer Mother        uterine vs cx dysplasia (had hyst)   Colon polyps Father 34       tested and polyps were removed   Liver disease Maternal Grandmother    Breast cancer Maternal Grandmother        25s   Bone cancer Paternal Grandmother    Lung cancer Paternal Grandfather    Breast cancer Maternal Aunt 36    Social History   Socioeconomic History   Marital status: Single    Spouse name: Not on file   Number of children: Not on file   Years of education: Not on file   Highest education level: Not on file  Occupational History   Not on file  Tobacco Use   Smoking status: Never   Smokeless tobacco: Never  Vaping Use   Vaping Use: Never used  Substance  and Sexual Activity   Alcohol use: No   Drug use: No   Sexual activity: Yes    Birth control/protection: Condom  Other Topics Concern   Not on file  Social History Narrative   Not on file   Social Determinants of Health   Financial Resource Strain: Not on file  Food Insecurity: Not on file  Transportation Needs: Not on file  Physical Activity: Not on file  Stress: Not on file  Social Connections: Not on file  Intimate Partner Violence: Not on file    Outpatient Medications Prior to Visit  Medication Sig Dispense Refill   fluticasone (FLONASE) 50 MCG/ACT nasal spray SPRAY 2 SPRAYS INTO EACH NOSTRIL EVERY DAY 16 mL 3   valACYclovir (VALTREX) 500 MG tablet Take 1 tablet (500 mg total) by mouth 2 (two) times daily. For 3 days prn sx (Patient not taking: Reported on 08/08/2020) 30 tablet 1   No facility-administered medications prior to visit.      ROS:  Review of Systems  Constitutional:  Negative for fever.  Gastrointestinal:  Negative for blood in stool, constipation, diarrhea, nausea and vomiting.  Genitourinary:  Positive for  vaginal bleeding and vaginal discharge. Negative for dyspareunia, dysuria, flank pain, frequency, hematuria, urgency and vaginal pain.  Musculoskeletal:  Negative for back pain.  Skin:  Negative for rash.  BREAST: No symptoms   OBJECTIVE:   Vitals:  BP 110/60   Ht 5' 6.5" (1.689 m)   Wt 188 lb (85.3 kg)   LMP 07/18/2020 (Exact Date)   BMI 29.89 kg/m   Physical Exam Vitals reviewed.  Constitutional:      Appearance: She is well-developed.  Pulmonary:     Effort: Pulmonary effort is normal.  Genitourinary:    General: Normal vulva.     Pubic Area: No rash.      Labia:        Right: No rash, tenderness or lesion.        Left: No rash, tenderness or lesion.      Vagina: Normal. No vaginal discharge, erythema or tenderness.     Cervix: Normal.     Uterus: Normal. Not enlarged and not tender.      Adnexa: Right adnexa normal and left  adnexa normal.       Right: No mass or tenderness.         Left: No mass or tenderness.    Musculoskeletal:        General: Normal range of motion.     Cervical back: Normal range of motion.  Skin:    General: Skin is warm and dry.  Neurological:     General: No focal deficit present.     Mental Status: She is alert and oriented to person, place, and time.  Psychiatric:        Mood and Affect: Mood normal.        Behavior: Behavior normal.        Thought Content: Thought content normal.        Judgment: Judgment normal.    Results: Results for orders placed or performed in visit on 08/08/20 (from the past 24 hour(s))  POCT Wet Prep with KOH     Status: Abnormal   Collection Time: 08/08/20  3:11 PM  Result Value Ref Range   Trichomonas, UA Negative    Clue Cells Wet Prep HPF POC pos    Epithelial Wet Prep HPF POC     Yeast Wet Prep HPF POC neg    Bacteria Wet Prep HPF POC     RBC Wet Prep HPF POC     WBC Wet Prep HPF POC     KOH Prep POC Positive (A) Negative     Assessment/Plan: BV (bacterial vaginosis) - Plan: metroNIDAZOLE (METROGEL) 0.75 % vaginal gel, POCT Wet Prep with KOH; pos sx and wet prep. Rx metrogel (with RF prn this summer). Add probiotics, f/u prn.   Screening for STD (sexually transmitted disease) - Plan: Cervicovaginal ancillary only    Meds ordered this encounter  Medications   metroNIDAZOLE (METROGEL) 0.75 % vaginal gel    Sig: Place 1 Applicatorful vaginally at bedtime for 5 days.    Dispense:  50 g    Refill:  2    Order Specific Question:   Supervising Provider    Answer:   Nadara Mustard [[id number]]       Return if symptoms worsen or fail to improve.  Zoee Heeney B. Verland Sprinkle, PA-C 08/08/2020 3:12 PM

## 2020-08-11 LAB — CERVICOVAGINAL ANCILLARY ONLY
Chlamydia: NEGATIVE
Comment: NEGATIVE
Comment: NORMAL
Neisseria Gonorrhea: NEGATIVE

## 2020-08-12 ENCOUNTER — Ambulatory Visit: Payer: Self-pay

## 2020-09-10 ENCOUNTER — Other Ambulatory Visit: Payer: Self-pay | Admitting: Obstetrics and Gynecology

## 2020-09-10 DIAGNOSIS — A6004 Herpesviral vulvovaginitis: Secondary | ICD-10-CM

## 2020-09-30 ENCOUNTER — Other Ambulatory Visit: Payer: Self-pay | Admitting: Obstetrics and Gynecology

## 2020-09-30 DIAGNOSIS — A6004 Herpesviral vulvovaginitis: Secondary | ICD-10-CM

## 2020-10-02 ENCOUNTER — Inpatient Hospital Stay: Admission: RE | Admit: 2020-10-02 | Payer: Managed Care, Other (non HMO) | Source: Ambulatory Visit

## 2020-10-23 ENCOUNTER — Other Ambulatory Visit: Payer: Self-pay | Admitting: Obstetrics and Gynecology

## 2020-10-23 DIAGNOSIS — A6004 Herpesviral vulvovaginitis: Secondary | ICD-10-CM

## 2020-11-13 ENCOUNTER — Encounter: Payer: Self-pay | Admitting: Obstetrics and Gynecology

## 2020-11-13 ENCOUNTER — Ambulatory Visit
Admission: RE | Admit: 2020-11-13 | Discharge: 2020-11-13 | Disposition: A | Discharge status: Home / Self Care | Payer: Managed Care, Other (non HMO) | Source: Ambulatory Visit | Attending: Obstetrics and Gynecology | Admitting: Obstetrics and Gynecology

## 2020-11-13 ENCOUNTER — Other Ambulatory Visit: Payer: Self-pay | Admitting: Obstetrics and Gynecology

## 2020-11-13 ENCOUNTER — Other Ambulatory Visit: Payer: Self-pay

## 2020-11-13 DIAGNOSIS — N6320 Unspecified lump in the left breast, unspecified quadrant: Secondary | ICD-10-CM

## 2020-11-13 DIAGNOSIS — R928 Other abnormal and inconclusive findings on diagnostic imaging of breast: Secondary | ICD-10-CM | POA: Diagnosis not present

## 2020-12-23 ENCOUNTER — Other Ambulatory Visit: Payer: Self-pay | Admitting: Obstetrics and Gynecology

## 2020-12-23 DIAGNOSIS — A6004 Herpesviral vulvovaginitis: Secondary | ICD-10-CM

## 2020-12-25 NOTE — Telephone Encounter (Signed)
Voicemail is full unable to leave message

## 2021-01-08 NOTE — Telephone Encounter (Signed)
Attempted to reach patient via phone. Voicemail is full unable to leave message

## 2021-03-05 ENCOUNTER — Other Ambulatory Visit: Payer: Self-pay

## 2021-03-05 ENCOUNTER — Ambulatory Visit (INDEPENDENT_AMBULATORY_CARE_PROVIDER_SITE_OTHER): Payer: Managed Care, Other (non HMO) | Admitting: Obstetrics and Gynecology

## 2021-03-05 ENCOUNTER — Encounter: Payer: Self-pay | Admitting: Obstetrics and Gynecology

## 2021-03-05 VITALS — BP 124/84 | Wt 192.0 lb

## 2021-03-05 DIAGNOSIS — Z202 Contact with and (suspected) exposure to infections with a predominantly sexual mode of transmission: Secondary | ICD-10-CM | POA: Diagnosis not present

## 2021-03-05 DIAGNOSIS — Z30015 Encounter for initial prescription of vaginal ring hormonal contraceptive: Secondary | ICD-10-CM | POA: Diagnosis not present

## 2021-03-05 DIAGNOSIS — Z113 Encounter for screening for infections with a predominantly sexual mode of transmission: Secondary | ICD-10-CM

## 2021-03-05 MED ORDER — ETONOGESTREL-ETHINYL ESTRADIOL 0.12-0.015 MG/24HR VA RING: VAGINAL_RING | VAGINAL | 0 refills | Status: DC

## 2021-03-05 NOTE — Progress Notes (Signed)
Melanie Griffin, Melanie Evener, PA-C   Chief Complaint  Patient presents with   Exposure to STD    HPI:      [name] is a 46 y.o. G3P1011 whose LMP was Patient's last menstrual period was 02/14/2021., presents today for STD testing. Partner recently with GU sx, diagnosed with urethritis and treated with rocephin empirically. Still waiting on STD testing results. Pt would like to be tested. No vag sx except possible slight d/c. No urin sx, no pelvic pain/LBP/fevers.  Menses are monthly, lasting 4-5 days, no BTB, no dysmen. Would like BC. Has been doing condoms sometimes and Plan B. No hx of HTN, DVTs, migraines with aura, tob use. Prefers non-daily method.  Annual due. Neg pap/neg HPV DNA; neg gon/chlam 8/21.    Patient Active Problem List   Diagnosis Date Noted   Enthesopathy of hip region 10/19/2019   Hair loss disorder 10/19/2019   Cervical high risk HPV (human papillomavirus) test positive 10/18/2019   Herpes simplex vulvovaginitis 10/18/2019   Trichomoniasis 07/13/2016   Genital herpes    Bursitis of right shoulder 08/21/2015   Biceps tendinitis of both upper extremities 03/13/2015   Tendinitis of both rotator cuffs 03/13/2015    Past Surgical History:  Procedure Laterality Date   CESAREAN SECTION  10/11/2003    Family History  Problem Relation Age of Onset   Hypertension Mother    Cancer Mother        uterine vs cx dysplasia (had hyst)   Colon polyps Father 56       tested and polyps were removed   Breast cancer Maternal Aunt 55   Liver disease Maternal Grandmother    Bone cancer Paternal Grandmother    Lung cancer Paternal Grandfather     Social History   Socioeconomic History   Marital status: Single    Spouse name: Not on file   Number of children: Not on file   Years of education: Not on file   Highest education level: Not on file  Occupational History   Not on file  Tobacco Use   Smoking status: Never   Smokeless tobacco: Never  Vaping Use    Vaping Use: Never used  Substance and Sexual Activity   Alcohol use: No   Drug use: No   Sexual activity: Yes    Birth control/protection: Condom  Other Topics Concern   Not on file  Social History Narrative   Not on file   Social Determinants of Health   Financial Resource Strain: Not on file  Food Insecurity: Not on file  Transportation Needs: Not on file  Physical Activity: Not on file  Stress: Not on file  Social Connections: Not on file  Intimate Partner Violence: Not on file    Outpatient Medications Prior to Visit  Medication Sig Dispense Refill   fluticasone (FLONASE) 50 MCG/ACT nasal spray SPRAY 2 SPRAYS INTO EACH NOSTRIL EVERY DAY 16 mL 3   valACYclovir (VALTREX) 500 MG tablet TAKE 1 TABLET BY MOUTH TWICE A DAY FOR 3 DAYS AS NEEDED 30 tablet 0   No facility-administered medications prior to visit.      ROS:  Review of Systems  Constitutional:  Negative for fever.  Gastrointestinal:  Negative for blood in stool, constipation, diarrhea, nausea and vomiting.  Genitourinary:  Negative for dyspareunia, dysuria, flank pain, frequency, hematuria, urgency, vaginal bleeding, vaginal discharge and vaginal pain.  Musculoskeletal:  Negative for back pain.  Skin:  Negative for rash.  BREAST:  No symptoms   OBJECTIVE:   Vitals:  BP 124/84 (BP Location: Left Arm, Patient Position: Sitting, Cuff Size: Large)    Wt 192 lb (87.1 kg)    LMP 02/14/2021    BMI 30.53 kg/m   Physical Exam Vitals reviewed.  Constitutional:      Appearance: She is well-developed.  Pulmonary:     Effort: Pulmonary effort is normal.  Genitourinary:    General: Normal vulva.     Pubic Area: No rash.      Labia:        Right: No rash, tenderness or lesion.        Left: No rash, tenderness or lesion.      Vagina: Normal. No vaginal discharge, erythema or tenderness.     Cervix: Normal.     Uterus: Normal. Not enlarged and not tender.      Adnexa: Right adnexa normal and left adnexa  normal.       Right: No mass or tenderness.         Left: No mass or tenderness.    Musculoskeletal:        General: Normal range of motion.     Cervical back: Normal range of motion.  Skin:    General: Skin is warm and dry.  Neurological:     General: No focal deficit present.     Mental Status: She is alert and oriented to person, place, and time.  Psychiatric:        Mood and Affect: Mood normal.        Behavior: Behavior normal.        Thought Content: Thought content normal.        Judgment: Judgment normal.    Assessment/Plan: Screening for STD (sexually transmitted disease) - Plan: Cervicovaginal ancillary only, HIV Antibody (routine testing w rflx), RPR, Hepatitis C antibody; STD testing today.Will f/u with results.   Exposure to STD - Plan: Cervicovaginal ancillary only; pt to also get results of partner's testing.  Encounter for initial prescription of vaginal ring hormonal contraceptive - Plan: etonogestrel-ethinyl estradiol (NUVARING) 0.12-0.015 MG/24HR vaginal ring; BC options discussed. Pt would like to try nuvaring. Rx eRxd. Start with next menses/condoms. RTO in 2 months for f/u and annual.    Meds ordered this encounter  Medications   etonogestrel-ethinyl estradiol (NUVARING) 0.12-0.015 MG/24HR vaginal ring    Sig: Insert vaginally and leave in place for 3 consecutive weeks, then remove for 1 week.    Dispense:  3 each    Refill:  0    Order Specific Question:   Supervising Provider    Answer:   Gae Dry [id number]      Return in about 2 months (around 05/03/2021) for annual; lab appt tomorrow AM.  Melanie Griffin. Melanie Speers, PA-C 03/06/2021 2:50 PM

## 2021-03-06 ENCOUNTER — Encounter: Payer: Self-pay | Admitting: Obstetrics and Gynecology

## 2021-03-06 ENCOUNTER — Other Ambulatory Visit: Payer: Managed Care, Other (non HMO)

## 2021-03-07 ENCOUNTER — Encounter: Payer: Self-pay | Admitting: Emergency Medicine

## 2021-03-07 ENCOUNTER — Ambulatory Visit
Admission: EM | Admit: 2021-03-07 | Discharge: 2021-03-07 | Disposition: A | Discharge status: Home / Self Care | Payer: Managed Care, Other (non HMO) | Attending: Family Medicine | Admitting: Family Medicine

## 2021-03-07 ENCOUNTER — Telehealth: Payer: Self-pay | Admitting: Obstetrics and Gynecology

## 2021-03-07 DIAGNOSIS — Z202 Contact with and (suspected) exposure to infections with a predominantly sexual mode of transmission: Secondary | ICD-10-CM | POA: Diagnosis not present

## 2021-03-07 DIAGNOSIS — L292 Pruritus vulvae: Secondary | ICD-10-CM | POA: Insufficient documentation

## 2021-03-07 LAB — POCT URINE PREGNANCY: Preg Test, Ur: NEGATIVE

## 2021-03-07 MED ORDER — DOXYCYCLINE HYCLATE 100 MG PO CAPS: 100.0000 mg | ORAL_CAPSULE | Freq: Two times a day (BID) | ORAL | 0 refills | Status: AC

## 2021-03-07 MED ORDER — METRONIDAZOLE 500 MG PO TABS: 500.0000 mg | ORAL_TABLET | Freq: Two times a day (BID) | ORAL | 0 refills | Status: DC

## 2021-03-07 MED ORDER — CEFTRIAXONE SODIUM 500 MG IJ SOLR
500.0000 mg | Freq: Once | INTRAMUSCULAR | Status: AC
  Administered 2021-03-07: 500 mg via INTRAMUSCULAR

## 2021-03-07 NOTE — Telephone Encounter (Signed)
Patient inquiring if test results have come back and to come in for treatment. FB#[phone]

## 2021-03-07 NOTE — Telephone Encounter (Signed)
Pt called with her partner's test results.  His results showed chlamydia and trichomonis.  She would like the injection for treatment instead of the pills.

## 2021-03-07 NOTE — ED Provider Notes (Signed)
Renaldo Fiddler    CSN: [id number] Arrival date & time: 03/07/21  1311      History   Chief Complaint Chief Complaint  Patient presents with   Vaginal Itching   Exposure to STD    HPI Melanie Griffin is a 46 y.o. female.   HPI STD Exposure, patient is here for STD treatment. Her partner tested positive for Chlamydia and Trichomoniasis. She is here for treatment today. She was seen at her OB/GYN and had testing however the results are not viewable. She was symptomatic until today. She endorse vaginal irritation only. Past Medical History:  Diagnosis Date   Acne    Bacterial vaginitis    Candida vaginitis    Cervical high risk human papillomavirus (HPV) DNA test positive 2018   Genital herpes    Hair loss disorder    History of Papanicolaou smear of cervix 10/27/2010   -/-/gc neg   Seasonal allergies    Trichomoniasis     Patient Active Problem List   Diagnosis Date Noted   Enthesopathy of hip region 10/19/2019   Hair loss disorder 10/19/2019   Cervical high risk HPV (human papillomavirus) test positive 10/18/2019   Herpes simplex vulvovaginitis 10/18/2019   Trichomoniasis 07/13/2016   Genital herpes    Bursitis of right shoulder 08/21/2015   Biceps tendinitis of both upper extremities 03/13/2015   Tendinitis of both rotator cuffs 03/13/2015    Past Surgical History:  Procedure Laterality Date   CESAREAN SECTION  10/11/2003    OB History     Gravida  3   Para  1   Term  1   Preterm      AB  1   Living  1      SAB  1   IAB      Ectopic      Multiple      Live Births  1            Home Medications    Prior to Admission medications   Medication Sig Start Date End Date Taking? Authorizing Provider  doxycycline (VIBRAMYCIN) 100 MG capsule Take 1 capsule (100 mg total) by mouth 2 (two) times daily for 7 days. 03/07/21 03/14/21 Yes Bing Neighbors, FNP  metroNIDAZOLE (FLAGYL) 500 MG tablet Take 1 tablet (500 mg total) by  mouth 2 (two) times daily with a meal. DO NOT CONSUME ALCOHOL WHILE TAKING THIS MEDICATION. 03/07/21  Yes Bing Neighbors, FNP  etonogestrel-ethinyl estradiol (NUVARING) 0.12-0.015 MG/24HR vaginal ring Insert vaginally and leave in place for 3 consecutive weeks, then remove for 1 week. 03/05/21   Copland, Helmut Muster B, PA-C  fluticasone (FLONASE) 50 MCG/ACT nasal spray SPRAY 2 SPRAYS INTO EACH NOSTRIL EVERY DAY 06/04/20   Copland, Helmut Muster B, PA-C  valACYclovir (VALTREX) 500 MG tablet TAKE 1 TABLET BY MOUTH TWICE A DAY FOR 3 DAYS AS NEEDED 09/11/20   Copland, Ilona Sorrel, PA-C    Family History Family History  Problem Relation Age of Onset   Hypertension Mother    Cancer Mother        uterine vs cx dysplasia (had hyst)   Colon polyps Father 30       tested and polyps were removed   Breast cancer Maternal Aunt 55   Liver disease Maternal Grandmother    Bone cancer Paternal Grandmother    Lung cancer Paternal Grandfather     Social History Social History   Tobacco Use   Smoking status: Never  Smokeless tobacco: Never  Vaping Use   Vaping Use: Never used  Substance Use Topics   Alcohol use: No   Drug use: No     Allergies   Iodine, Other, and Shellfish allergy   Review of Systems Review of Systems Pertinent negatives listed in HPI   Physical Exam Triage Vital Signs ED Triage Vitals  Enc Vitals Group     BP      Pulse      Resp      Temp      Temp src      SpO2      Weight      Height      Head Circumference      Peak Flow      Pain Score      Pain Loc      Pain Edu?      Excl. in GC?    No data found.  Updated Vital Signs LMP 02/14/2021   Visual Acuity Right Eye Distance:   Left Eye Distance:   Bilateral Distance:    Right Eye Near:   Left Eye Near:    Bilateral Near:     Physical Exam General appearance:Alert, well developed, well nourished, cooperative  Head: Normocephalic, without obvious abnormality, atraumatic Heart:Rate and rhythm normal.   Respiratory: Respirations even and unlabored, normal respiratory rate Extremities: No gross deformities Skin: Skin color, texture, turgor normal. No rashes seen  Psych: Appropriate mood and affect. Vaginal Cytology Self-Collected  UC Treatments / Results  Labs (all labs ordered are listed, but only abnormal results are displayed) Labs Reviewed  POCT URINE PREGNANCY  CERVICOVAGINAL ANCILLARY ONLY    EKG   Radiology No results found.  Procedures Procedures (including critical care time)  Medications Ordered in UC Medications  cefTRIAXone (ROCEPHIN) injection 500 mg (500 mg Intramuscular Given 03/07/21 1432)    Initial Impression / Assessment and Plan / UC Course  I have reviewed the triage vital signs and the nursing notes.  Pertinent labs & imaging results that were available during my care of the patient were reviewed by me and considered in my medical decision making (see chart for details).     STD Exposure Covered with Rocephin, doxycycline and Flagyl. Cytology pending. RTC PRN Final Clinical Impressions(s) / UC Diagnoses   Final diagnoses:  STD exposure   Discharge Instructions   None    ED Prescriptions     Medication Sig Dispense Auth. Provider   metroNIDAZOLE (FLAGYL) 500 MG tablet Take 1 tablet (500 mg total) by mouth 2 (two) times daily with a meal. DO NOT CONSUME ALCOHOL WHILE TAKING THIS MEDICATION. 14 tablet Bing Neighbors, FNP   doxycycline (VIBRAMYCIN) 100 MG capsule Take 1 capsule (100 mg total) by mouth 2 (two) times daily for 7 days. 14 capsule Bing Neighbors, FNP      PDMP not reviewed this encounter.   Bing Neighbors, FNP 03/07/21 1447

## 2021-03-07 NOTE — Telephone Encounter (Signed)
Spoke w/patient. Advised by the time her messages were received, she was already checked in and being seen at Urgent Care. Explained that her lab had not resulted and I had contacted [facility] to check the status of the aptima. They did not have record of the specimen. We are still researching this issue. Patient works in [employer], music and is upset that Helmut Muster knew how anxious she was about this result. She's upset that she didn't get a call to tell her that the results weren't back. She doesn't feel that she should receive a bill for this visit as now she has had to go to an urgent care to get retested and treated which she will be billed for. Advised will relay this information to proper personnel for handling.

## 2021-03-07 NOTE — ED Triage Notes (Signed)
Pt here with mild vaginal irritation starting today, but has had known exposure to chlamydia and trichomonas.

## 2021-03-07 NOTE — Telephone Encounter (Signed)
Lab has not resulted. Spoke w/Mary at [facility] cytology. They do not have an aptima on this patient.

## 2021-03-09 NOTE — Telephone Encounter (Signed)
This was the last pt seen on Wed in room 13 when Marcelino Duster was with me. I found the STD lab order on my printer Thurs AM and gave it to Casar to check on the specimen. Not sure if this info is helpful.  Looks like urgent care already treated her for chlamydia and trich.

## 2021-03-10 ENCOUNTER — Other Ambulatory Visit: Payer: Self-pay | Admitting: Obstetrics and Gynecology

## 2021-03-10 LAB — CERVICOVAGINAL ANCILLARY ONLY
Bacterial Vaginitis (gardnerella): POSITIVE — AB
Candida Glabrata: NEGATIVE
Candida Vaginitis: POSITIVE — AB
Chlamydia: NEGATIVE
Comment: NEGATIVE
Comment: NEGATIVE
Comment: NEGATIVE
Comment: NEGATIVE
Comment: NEGATIVE
Comment: NORMAL
Neisseria Gonorrhea: NEGATIVE
Trichomonas: POSITIVE — AB

## 2021-03-10 MED ORDER — FLUCONAZOLE 150 MG PO TABS: 150.0000 mg | ORAL_TABLET | Freq: Once | ORAL | 0 refills | Status: DC

## 2021-03-10 NOTE — Progress Notes (Signed)
Rx diflucan for pos yeast on culture

## 2021-03-10 NOTE — Telephone Encounter (Signed)
Spoke with pt about missing STD specimen. I spent the AM talking to staff who would have processed it in office but no one remembers it specifically. Confirmed with lab they don't have specimen. Apologized to pt.  Pt LM Fri with results of her partner's STD results but it was over lunch and she then went to urgent care before our office had time to call her back.  Pt's STD testing from Urgent Care is resulted and pt has BV, yeast and trich. Pt's partner had chlamydia and trich. Pt already Rxd flagyl and doxy, but not diflucan. Can d/c doxy Rx. I Rxd diflucan for pt. Discussed abstinence till both have completed tx for a wk. Recommended TOC in 4 wks.  Pt questions if will receive bill for office appt 03/05/21. I told her it would have already been sent to insurance but I will pass along to office mgr.

## 2021-03-11 ENCOUNTER — Telehealth (HOSPITAL_COMMUNITY): Payer: Self-pay | Admitting: Emergency Medicine

## 2021-03-11 MED ORDER — FLUCONAZOLE 150 MG PO TABS: 150.0000 mg | ORAL_TABLET | Freq: Once | ORAL | 0 refills | Status: AC

## 2021-04-06 ENCOUNTER — Other Ambulatory Visit: Payer: Self-pay | Admitting: Obstetrics and Gynecology

## 2021-04-16 ENCOUNTER — Other Ambulatory Visit (HOSPITAL_COMMUNITY)
Admission: RE | Admit: 2021-04-16 | Discharge: 2021-04-16 | Disposition: A | Discharge status: Home / Self Care | Payer: Managed Care, Other (non HMO) | Source: Ambulatory Visit | Attending: Obstetrics and Gynecology | Admitting: Obstetrics and Gynecology

## 2021-04-16 ENCOUNTER — Ambulatory Visit (INDEPENDENT_AMBULATORY_CARE_PROVIDER_SITE_OTHER): Payer: Managed Care, Other (non HMO) | Admitting: Obstetrics and Gynecology

## 2021-04-16 ENCOUNTER — Other Ambulatory Visit: Payer: Self-pay

## 2021-04-16 ENCOUNTER — Encounter: Payer: Self-pay | Admitting: Obstetrics and Gynecology

## 2021-04-16 VITALS — BP 90/70 | Ht 66.5 in | Wt 184.0 lb

## 2021-04-16 DIAGNOSIS — R87810 Cervical high risk human papillomavirus (HPV) DNA test positive: Secondary | ICD-10-CM | POA: Insufficient documentation

## 2021-04-16 DIAGNOSIS — Z1151 Encounter for screening for human papillomavirus (HPV): Secondary | ICD-10-CM | POA: Diagnosis not present

## 2021-04-16 DIAGNOSIS — A599 Trichomoniasis, unspecified: Secondary | ICD-10-CM

## 2021-04-16 DIAGNOSIS — Z124 Encounter for screening for malignant neoplasm of cervix: Secondary | ICD-10-CM | POA: Insufficient documentation

## 2021-04-16 DIAGNOSIS — A6004 Herpesviral vulvovaginitis: Secondary | ICD-10-CM

## 2021-04-16 DIAGNOSIS — Z1231 Encounter for screening mammogram for malignant neoplasm of breast: Secondary | ICD-10-CM

## 2021-04-16 DIAGNOSIS — Z3044 Encounter for surveillance of vaginal ring hormonal contraceptive device: Secondary | ICD-10-CM

## 2021-04-16 DIAGNOSIS — L659 Nonscarring hair loss, unspecified: Secondary | ICD-10-CM

## 2021-04-16 DIAGNOSIS — N898 Other specified noninflammatory disorders of vagina: Secondary | ICD-10-CM

## 2021-04-16 DIAGNOSIS — Z01419 Encounter for gynecological examination (general) (routine) without abnormal findings: Secondary | ICD-10-CM

## 2021-04-16 DIAGNOSIS — Z113 Encounter for screening for infections with a predominantly sexual mode of transmission: Secondary | ICD-10-CM

## 2021-04-16 LAB — POCT WET PREP WITH KOH
Clue Cells Wet Prep HPF POC: NEGATIVE
KOH Prep POC: NEGATIVE
Trichomonas, UA: NEGATIVE
Yeast Wet Prep HPF POC: NEGATIVE

## 2021-04-16 MED ORDER — ETONOGESTREL-ETHINYL ESTRADIOL 0.12-0.015 MG/24HR VA RING: VAGINAL_RING | VAGINAL | 3 refills | Status: DC

## 2021-04-16 MED ORDER — VALACYCLOVIR HCL 500 MG PO TABS: ORAL_TABLET | ORAL | 0 refills | Status: DC

## 2021-04-16 NOTE — Patient Instructions (Signed)
I value your feedback and you entrusting us with your care. If you get a [facility] patient survey, I would appreciate you taking the time to let us know about your experience today. Thank you! ? ? ?

## 2021-04-16 NOTE — Progress Notes (Signed)
Chief Complaint  Patient presents with   Gynecologic Exam    No concerns    HPI:      [name] is a 46 y.o. G3P1011 who LMP was Patient's last menstrual period was 04/08/2021 (exact date)., presents today for her annual examination.  Her menses are regular every 28-30 days, lasting 4 days, light on nuvaring now.  Dysmenorrhea none. She does not have intermenstrual bleeding. No vasomotor sx.  Sex activity:  single partner, contraception - nuvaring. Was diagnosed with trich 1/23 and treated with flagyl, partner also treated. Needs test of cure. She would like full lab STD testing    Has been having ext vaginal itching recently. Treated with diflucan without sx relief. No increased d/c, odor. Has changed soap and now using scented dryer sheets.   Last Pap: 10/19/19 and 04/20/18  Results were: no abnormalities /neg HPV DNA. POS HPV DNA 2019.  Repeat pap due today. If neg, will then do Q3 yrs.  Hx of STDs: chlamydia, HSV, HPV, trich. She takes valtrex prn outbreaks and needs Rx RF. Diagnosed with trich on culture 1/23, treated with flagyl. Due for TOC today  Last mammogram: 11/13/20  Results: normal, repeat in 12 months. Benign cyst LT breast There is a FH of breast cancer in her mat aunt and MGM, genetic testing not indicated. There is no FH of ovarian cancer. The patient  does self-breast exams.  Tobacco use: The patient denies current or previous tobacco use. Alcohol use: none No drug use Exercise: moderately active  She does not get adequate calcium or Vitamin D in her diet.  She had neg labs/lipids 8/21. Needs labs for hair loss disorder. Pt gives to derm.  Colonoscopy: 1/22 at [facility]; repeat due after 10 yrs per pt  Past Medical History:  Diagnosis Date   Acne    Bacterial vaginitis    Candida vaginitis    Cervical high risk human papillomavirus (HPV) DNA test positive 2018   Genital herpes    Hair loss disorder    History of Papanicolaou smear of cervix 10/27/2010    -/-/gc neg   Seasonal allergies    Trichomoniasis     Past Surgical History:  Procedure Laterality Date   CESAREAN SECTION  10/11/2003    Family History  Problem Relation Age of Onset   Hypertension Mother    Cancer Mother        uterine vs cx dysplasia (had hyst)   Colon polyps Father 68       tested and polyps were removed   Breast cancer Maternal Aunt 55   Liver disease Maternal Grandmother    Bone cancer Paternal Grandmother    Lung cancer Paternal Grandfather     Social History   Socioeconomic History   Marital status: Single    Spouse name: Not on file   Number of children: Not on file   Years of education: Not on file   Highest education level: Not on file  Occupational History   Not on file  Tobacco Use   Smoking status: Never   Smokeless tobacco: Never  Vaping Use   Vaping Use: Never used  Substance and Sexual Activity   Alcohol use: No   Drug use: No   Sexual activity: Yes    Birth control/protection: Condom, Inserts    Comment: Nuvaring  Other Topics Concern   Not on file  Social History Narrative   Not on file   Social Determinants of Health  Financial Resource Strain: Not on file  Food Insecurity: Not on file  Transportation Needs: Not on file  Physical Activity: Not on file  Stress: Not on file  Social Connections: Not on file  Intimate Partner Violence: Not on file     Current Outpatient Medications:    etonogestrel-ethinyl estradiol (NUVARING) 0.12-0.015 MG/24HR vaginal ring, Insert vaginally and leave in place for 3 consecutive weeks, then remove for 1 week., Disp: 3 each, Rfl: 3   valACYclovir (VALTREX) 500 MG tablet, TAKE 1 TABLET BY MOUTH TWICE A DAY FOR 3 DAYS AS NEEDED, Disp: 30 tablet, Rfl: 0  ROS:  Review of Systems  Constitutional:  Negative for fatigue, fever and unexpected weight change.  Respiratory:  Negative for cough, shortness of breath and wheezing.   Cardiovascular:  Negative for chest pain, palpitations and  leg swelling.  Gastrointestinal:  Negative for blood in stool, constipation, diarrhea, nausea and vomiting.  Endocrine: Negative for cold intolerance, heat intolerance and polyuria.  Genitourinary:  Negative for dyspareunia, dysuria, flank pain, frequency, genital sores, hematuria, menstrual problem, pelvic pain, urgency, vaginal bleeding, vaginal discharge and vaginal pain.  Musculoskeletal:  Negative for back pain, joint swelling and myalgias.  Skin:  Negative for rash.  Neurological:  Negative for dizziness, syncope, light-headedness, numbness and headaches.  Hematological:  Negative for adenopathy.  Psychiatric/Behavioral:  Negative for agitation, confusion, sleep disturbance and suicidal ideas. The patient is not nervous/anxious.     Objective: BP 90/70    Ht 5' 6.5" (1.689 m)    Wt 184 lb (83.5 kg)    LMP 04/08/2021 (Exact Date)    BMI 29.25 kg/m    Physical Exam Constitutional:      Appearance: She is well-developed.  Genitourinary:     Vulva normal.     Genitourinary Comments: NEG EXT VAG EXAM     Right Labia: No rash, tenderness or lesions.    Left Labia: No tenderness, lesions or rash.    No vaginal discharge, erythema or tenderness.      Right Adnexa: not tender and no mass present.    Left Adnexa: not tender and no mass present.    No cervical friability or polyp.     Uterus is not enlarged or tender.  Breasts:    Right: No mass, nipple discharge, skin change or tenderness.     Left: No mass, nipple discharge, skin change or tenderness.  Neck:     Thyroid: No thyromegaly.  Cardiovascular:     Rate and Rhythm: Normal rate and regular rhythm.     Heart sounds: Normal heart sounds. No murmur heard. Pulmonary:     Effort: Pulmonary effort is normal.     Breath sounds: Normal breath sounds.  Abdominal:     Palpations: Abdomen is soft.     Tenderness: There is no abdominal tenderness. There is no guarding or rebound.  Musculoskeletal:        General: Normal range of  motion.     Cervical back: Normal range of motion.  Lymphadenopathy:     Cervical: No cervical adenopathy.  Neurological:     General: No focal deficit present.     Mental Status: She is alert and oriented to person, place, and time.     Cranial Nerves: No cranial nerve deficit.  Skin:    General: Skin is warm and dry.  Psychiatric:        Mood and Affect: Mood normal.        Behavior: Behavior normal.  Thought Content: Thought content normal.        Judgment: Judgment normal.  Vitals reviewed.   Results for orders placed or performed in visit on 04/16/21 (from the past 24 hour(s))  POCT Wet Prep with KOH     Status: Normal   Collection Time: 04/16/21  2:22 PM  Result Value Ref Range   Trichomonas, UA Negative    Clue Cells Wet Prep HPF POC neg    Epithelial Wet Prep HPF POC     Yeast Wet Prep HPF POC neg    Bacteria Wet Prep HPF POC     RBC Wet Prep HPF POC     WBC Wet Prep HPF POC     KOH Prep POC Negative Negative    Assessment/Plan: Encounter for annual routine gynecological examination  Cervical cancer screening - Plan: Cytology - PAP,   Cervical high risk HPV (human papillomavirus) test positive - Plan: Cytology - PAP,   Screening for HPV (human papillomavirus) - Plan: Cytology - PAP,   Trichomoniasis - Plan: NuSwab Vaginitis Plus (VG+),   Screening for STD (sexually transmitted disease) - Plan: HIV Antibody (routine testing w rflx), RPR, Hepatitis C antibody, NuSwab Vaginitis Plus (VG+),   Encounter for surveillance of vaginal ring hormonal contraceptive device - Plan: etonogestrel-ethinyl estradiol (NUVARING) 0.12-0.015 MG/24HR vaginal ring; nuvaring RF eRxd.   Encounter for screening mammogram for malignant neoplasm of breast - Plan: MM 3D SCREEN BREAST BILATERAL; pt current on mammo till 9/23  Herpes simplex vulvovaginitis - Plan: valACYclovir (VALTREX) 500 MG tablet; Rx RF prn sx  Hair loss disorder - Plan: TSH + free T4, Testosterone,Free and Total,  Ferritin, DHEA  Vaginal itching - Plan: NuSwab Vaginitis Plus (VG+), POCT Wet Prep with KOH, neg wet prep/exam. Rule out infection with culture. If neg, most likely chem derm. Dove sens skin soap/line dry underwear, OTC hydrocortisone crm ext prn. F/u prn.    Meds ordered this encounter  Medications   etonogestrel-ethinyl estradiol (NUVARING) 0.12-0.015 MG/24HR vaginal ring    Sig: Insert vaginally and leave in place for 3 consecutive weeks, then remove for 1 week.    Dispense:  3 each    Refill:  3    Order Specific Question:   Supervising Provider    Answer:   Gae Dry [id number]   valACYclovir (VALTREX) 500 MG tablet    Sig: TAKE 1 TABLET BY MOUTH TWICE A DAY FOR 3 DAYS AS NEEDED    Dispense:  30 tablet    Refill:  0    Order Specific Question:   Supervising Provider    Answer:   Gae Dry [id number]               GYN counsel mammography screening, adequate intake of calcium and vitamin D     F/U  Return in about 1 year (around 04/16/2022).  Kemba Hoppes B. [place] Stell, PA-C 04/16/2021 2:22 PM

## 2021-04-19 LAB — NUSWAB VAGINITIS PLUS (VG+)
Candida albicans, NAA: NEGATIVE
Candida glabrata, NAA: NEGATIVE
Chlamydia trachomatis, NAA: NEGATIVE
Megasphaera 1: HIGH Score — AB
Neisseria gonorrhoeae, NAA: NEGATIVE
Trich vag by NAA: NEGATIVE

## 2021-04-20 MED ORDER — METRONIDAZOLE 500 MG PO TABS: ORAL_TABLET | ORAL | 0 refills | Status: DC

## 2021-04-20 NOTE — Addendum Note (Signed)
Addended by: Althea Grimmer B on: 04/20/2021 09:17 AM   Modules accepted: Orders

## 2021-04-21 ENCOUNTER — Encounter: Payer: Self-pay | Admitting: Obstetrics and Gynecology

## 2021-04-21 LAB — DHEA: Dehydroepiandrosterone (DHEA): 297 ng/dL (ref 31–701)

## 2021-04-21 LAB — CYTOLOGY - PAP
Comment: NEGATIVE
Diagnosis: NEGATIVE
High risk HPV: NEGATIVE

## 2021-04-21 LAB — TESTOSTERONE,FREE AND TOTAL
Testosterone, Free: 1.1 pg/mL (ref 0.0–4.2)
Testosterone: 21 ng/dL (ref 4–50)

## 2021-04-21 LAB — TSH+FREE T4
Free T4: 1.31 ng/dL (ref 0.82–1.77)
TSH: 0.818 u[IU]/mL (ref 0.450–4.500)

## 2021-04-21 LAB — FERRITIN: Ferritin: 24 ng/mL (ref 15–150)

## 2021-04-21 LAB — RPR: RPR Ser Ql: NONREACTIVE

## 2021-04-21 LAB — HIV ANTIBODY (ROUTINE TESTING W REFLEX): HIV Screen 4th Generation wRfx: NONREACTIVE

## 2021-04-21 LAB — HEPATITIS C ANTIBODY: Hep C Virus Ab: NONREACTIVE

## 2021-04-28 ENCOUNTER — Other Ambulatory Visit: Payer: Self-pay | Admitting: Obstetrics and Gynecology

## 2021-04-28 DIAGNOSIS — A6004 Herpesviral vulvovaginitis: Secondary | ICD-10-CM

## 2021-05-05 ENCOUNTER — Ambulatory Visit: Payer: Managed Care, Other (non HMO) | Admitting: Obstetrics and Gynecology

## 2021-05-13 ENCOUNTER — Emergency Department
Admission: EM | Admit: 2021-05-13 | Discharge: 2021-05-13 | Disposition: A | Discharge status: Home / Self Care | Payer: Managed Care, Other (non HMO) | Attending: Emergency Medicine | Admitting: Emergency Medicine

## 2021-05-13 DIAGNOSIS — F332 Major depressive disorder, recurrent severe without psychotic features: Secondary | ICD-10-CM | POA: Diagnosis present

## 2021-05-13 DIAGNOSIS — G4700 Insomnia, unspecified: Secondary | ICD-10-CM | POA: Diagnosis not present

## 2021-05-13 DIAGNOSIS — F410 Panic disorder [episodic paroxysmal anxiety] without agoraphobia: Secondary | ICD-10-CM | POA: Diagnosis present

## 2021-05-13 DIAGNOSIS — F411 Generalized anxiety disorder: Secondary | ICD-10-CM | POA: Diagnosis not present

## 2021-05-13 DIAGNOSIS — F339 Major depressive disorder, recurrent, unspecified: Secondary | ICD-10-CM | POA: Insufficient documentation

## 2021-05-13 DIAGNOSIS — F32A Depression, unspecified: Secondary | ICD-10-CM | POA: Diagnosis present

## 2021-05-13 DIAGNOSIS — F419 Anxiety disorder, unspecified: Secondary | ICD-10-CM

## 2021-05-13 DIAGNOSIS — R000 Tachycardia, unspecified: Secondary | ICD-10-CM | POA: Diagnosis not present

## 2021-05-13 MED ORDER — HYDROXYZINE HCL 25 MG PO TABS: 25.0000 mg | ORAL_TABLET | Freq: Three times a day (TID) | ORAL | 0 refills | Status: DC | PRN

## 2021-05-13 MED ORDER — TRAZODONE HCL 100 MG PO TABS: 100.0000 mg | ORAL_TABLET | Freq: Every evening | ORAL | 1 refills | Status: DC | PRN

## 2021-05-13 MED ORDER — ONDANSETRON 4 MG PO TBDP
4.0000 mg | ORAL_TABLET | Freq: Once | ORAL | Status: AC
  Administered 2021-05-13: 4 mg via ORAL

## 2021-05-13 NOTE — ED Provider Notes (Signed)
? ?[facility] ?Provider Note ? ? ? Event Date/Time  ? First MD Initiated Contact with Patient 05/13/21 2026   ?  (approximate) ? ?History  ? ?Chief Complaint: Insomnia ? ?HPI ? ?Melanie Griffin is a 46 y.o. female with no significant past medical history presents to the emergency department for insomnia and depression.  According to the patient she is going through some relationship issues currently and states she has been more depressed recently has not been sleeping at night.  States she will fall asleep for 45 minutes to an hour at times but then will be awake and is restless.  States this is affecting her job and she is tired throughout the day but cannot seem to sleep at night.  Patient has tried taking Benadryl without success.  Patient is tearful at times during my evaluation.  States she thinks she would feel better if she could sleep.  Denies any SI or HI. ? ?Physical Exam  ? ?Triage Vital Signs: ?ED Triage Vitals  ?Enc Vitals Group  ?   BP 05/13/21 1956 (!) 147/93  ?   Pulse Rate 05/13/21 1956 100  ?   Resp 05/13/21 1956 20  ?   Temp 05/13/21 1956 98.3 ?F (36.8 ?C)  ?   Temp Source 05/13/21 1956 Oral  ?   SpO2 05/13/21 1956 97 %  ?   Weight 05/13/21 1957 176 lb (79.8 kg)  ?   Height 05/13/21 1957 5' 6.5" (1.689 m)  ?   Head Circumference --   ?   Peak Flow --   ?   Pain Score 05/13/21 1957 8  ?   Pain Loc --   ?   Pain Edu? --   ?   Excl. in GC? --   ? ? ?Most recent vital signs: ?Vitals:  ? 05/13/21 1956  ?BP: (!) 147/93  ?Pulse: 100  ?Resp: 20  ?Temp: 98.3 ?F (36.8 ?C)  ?SpO2: 97%  ? ? ?General: Awake Tearful at times. ?CV:  Good peripheral perfusion.  Regular rate and rhythm  ?Resp:  Normal effort.  Equal breath sounds bilaterally.  ?Abd:  No distention.  Soft, nontender.  No rebound or guarding. ? ? ?ED Results / Procedures / Treatments  ? ? ?MEDICATIONS ORDERED IN ED: ?Medications - No data to display ? ? ?IMPRESSION / MDM / ASSESSMENT AND PLAN / ED COURSE  ?I reviewed the  triage vital signs and the nursing notes. ? ?Patient presents to the emergency department for insomnia.  States she has not been able to sleep recently due to some relationship issues she is having.  States she is having depression as well but no SI or HI.  Patient came voluntarily looking for help with her insomnia.  Patient states she believes she would feel better if she could just get some sleep.  Patient denies any medical symptoms.  I offered to have psychiatry evaluate.  Patient is agreeable to psychiatric evaluation, but states that that is going to take too long she would rather go home and follow-up with psychiatry as an outpatient if we could provide her resources.  We will have psychiatry and TTS evaluate.  From the insomnia standpoint we will place the patient on trazodone. ? ?Psychiatry is seen and evaluated the patient.  They believe the patient would benefit from Vistaril to be used during the day if needed for panic attacks and we will use trazodone 100 mg at night.  Patient is agreeable to  plan of care.  They have provided the patient with outpatient resources to follow-up.  Patient agreeable to plan. ? ?FINAL CLINICAL IMPRESSION(S) / ED DIAGNOSES  ? ?Depression ?Insomnia ? ?Rx / DC Orders  ? ?Trazodone ?Psychiatry follow-up ? ?Note:  This document was prepared using Dragon voice recognition software and may include unintentional dictation errors. ?  Minna Antis, MD ?05/13/21 2213 ? ?

## 2021-05-13 NOTE — Consult Note (Signed)
[facility] Face-to-Face Psychiatry Consult  ? ?Reason for Consult: Insomnia ?Referring Physician: Dr. Kerman Passey ?Patient Identification: Melanie Griffin ?MRN:  [id number] ?Principal Diagnosis: <principal problem not specified> ?Diagnosis:  Active Problems: ?  MDD (major depressive disorder), recurrent severe, without psychosis ([place]) ?  GAD (generalized anxiety disorder) ?  Panic attacks ? ? ?Total Time spent with patient: 1 hour ? ?Subjective: "This is too hard to deal with." ?Melanie Griffin is a 46 y.o. female patient presented to [facility] ED via POV voluntary. The patient came in with complaints of insomnia for several days. The patient is highly emotional during the entire assessment. She endorses some stressors about a relationship that she believes contribute to her lack of sleep. The patient was very uncomfortable being here in the ED and being seen for mental health problems. 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 The patient was assured that her privacy would be protected, and she needed to focus on herself. The patient has no mental health history and is not on any medications. The patient does admit to seeing a therapist with her daughter and her daughter's father. The patient complained of panic attacks and increased anxiety.  ? ?This provider saw the patient face-to-face; the chart was reviewed, and consulted with Dr. Kerman Passey on 05/13/2021 due to the patient's care. It was discussed with the EDP that the patient does not meet the criteria to be admitted to the psychiatric inpatient unit. This Probation officer discussed with Dr. Kerman Passey that the patient could benefit from something to help her sleep and something for her anxiety. The EDP was agreeable to prescribe Trazadone and hydroxyzine. The patient was educated on the medication, and she was encouraged to follow up with outpatient therapy which she was given resources by Ms. Handy  IT sales professional).  ?On evaluation, the patient is alert and oriented x 4, calm, very emotional but cooperative, and mood-congruent with affect. The patient does not appear to be responding to internal or external stimuli. Neither is the patient presenting with any delusional thinking. The patient denies auditory or visual hallucinations. The patient denies any suicidal, homicidal, or self-harm ideations. The patient is not presenting with any psychotic or paranoid behaviors. During an encounter with the patient, she could answer questions appropriately. ? ?HPI: Dr. Kerman Passey, Melanie Griffin is a 46 y.o. female with no significant past medical history presents to the emergency department for insomnia and depression.  According to the patient she is going through some relationship issues currently and states she has been more depressed recently has not been sleeping at night.  States she will fall asleep for 45 minutes to an hour at times but then will be awake and is restless.  States this is affecting her job and she is tired throughout the day but cannot seem to sleep at night.  Patient has tried taking Benadryl without success.  Patient is tearful at times during my evaluation.  States she thinks she would feel better if she could sleep.  Denies any SI or HI. ? ?Past Psychiatric History:  ? ?Risk to Self:   ?Risk to Others:   ?Prior Inpatient Therapy:   ?Prior Outpatient Therapy:   ? ?Past Medical History:  ?Past Medical History:  ?Diagnosis Date  ? Acne   ? Bacterial vaginitis   ? Candida vaginitis   ? Cervical high risk human papillomavirus (HPV) DNA test positive 2018  ? Genital herpes   ?  Hair loss disorder   ? History of Papanicolaou smear of cervix 10/27/2010  ? -/-/gc neg  ? Seasonal allergies   ? Trichomoniasis   ?  ?Past Surgical History:  ?Procedure Laterality Date  ? CESAREAN SECTION  10/11/2003  ? ?Family History:  ?Family History  ?Problem Relation Age of Onset  ? Hypertension Mother   ? Cancer  Mother   ?     uterine vs cx dysplasia (had hyst)  ? Colon polyps Father 52  ?     tested and polyps were removed  ? Breast cancer Maternal Aunt 56  ? Liver disease Maternal Grandmother   ? Bone cancer Paternal Grandmother   ? Lung cancer Paternal Grandfather   ? ?Family Psychiatric  History:  ?Social History:  ?Social History  ? ?Substance and Sexual Activity  ?Alcohol Use No  ?   ?Social History  ? ?Substance and Sexual Activity  ?Drug Use No  ?  ?Social History  ? ?Socioeconomic History  ? Marital status: Single  ?  Spouse name: Not on file  ? Number of children: Not on file  ? Years of education: Not on file  ? Highest education level: Not on file  ?Occupational History  ? Not on file  ?Tobacco Use  ? Smoking status: Never  ? Smokeless tobacco: Never  ?Vaping Use  ? Vaping Use: Never used  ?Substance and Sexual Activity  ? Alcohol use: No  ? Drug use: No  ? Sexual activity: Yes  ?  Birth control/protection: Condom, Inserts  ?  Comment: Nuvaring  ?Other Topics Concern  ? Not on file  ?Social History Narrative  ? Not on file  ? ?Social Determinants of Health  ? ?Financial Resource Strain: Not on file  ?Food Insecurity: Not on file  ?Transportation Needs: Not on file  ?Physical Activity: Not on file  ?Stress: Not on file  ?Social Connections: Not on file  ? ?Additional Social History: ?  ? ?Allergies:   ?Allergies  ?Allergen Reactions  ? Iodine   ? Other   ?  cashews  ? Shellfish Allergy   ? ? ?Labs: No results found for this or any previous visit (from the past 48 hour(s)). ? ?No current facility-administered medications for this encounter.  ? ?Current Outpatient Medications  ?Medication Sig Dispense Refill  ? hydrOXYzine (ATARAX) 25 MG tablet Take 1 tablet (25 mg total) by mouth 3 (three) times daily as needed for anxiety. 20 tablet 0  ? traZODone (DESYREL) 100 MG tablet Take 1 tablet (100 mg total) by mouth at bedtime as needed for sleep. 30 tablet 1  ? etonogestrel-ethinyl estradiol (NUVARING) 0.12-0.015  MG/24HR vaginal ring Insert vaginally and leave in place for 3 consecutive weeks, then remove for 1 week. 3 each 3  ? metroNIDAZOLE (FLAGYL) 500 MG tablet Take 1 tab BID for 7 days; NO alcohol use for 10 days after prescription start 14 tablet 0  ? valACYclovir (VALTREX) 500 MG tablet TAKE 1 TABLET BY MOUTH TWICE A DAY FOR 3 DAYS AS NEEDED 30 tablet 0  ? ? ?Musculoskeletal: ?Strength & Muscle Tone: within normal limits ?Gait & Station: normal ?Patient leans: N/A ? ?Psychiatric Specialty Exam: ? ?Presentation  ?General Appearance: Appropriate for Environment ? ?Eye Contact:Good ? ?Speech:Clear and Coherent ? ?Speech Volume:Decreased ? ?Handedness:Right ? ? ?Mood and Affect  ?Mood:Anxious; Depressed; Hopeless ? ?Affect:Flat; Depressed; Congruent; Tearful ? ? ?Thought Process  ?Thought Processes:Coherent ? ?Descriptions of Associations:Intact ? ?Orientation:Full (Time, Place and Person) ? ?Thought  Content:Logical ? ?History of Schizophrenia/Schizoaffective disorder:No data recorded ?Duration of Psychotic Symptoms:No data recorded ?Hallucinations:Hallucinations: None ? ?Ideas of Reference:None ? ?Suicidal Thoughts:Suicidal Thoughts: No ? ?Homicidal Thoughts:Homicidal Thoughts: No ? ? ?Sensorium  ?Memory:Immediate Good; Recent Good; Remote Good ? ?Judgment:Intact ? ?Insight:Fair ? ? ?Executive Functions  ?Concentration:Fair ? ?Attention Span:Fair ? ?Recall:Good ? ?[employer] [place] ? ?Language:Good ? ? ?Psychomotor Activity  ?Psychomotor Activity:Psychomotor Activity: Normal ? ? ?Assets  ?Assets:Communication Skills; Desire for Improvement; Social Support ? ? ?Sleep  ?Sleep:Sleep: Poor ? ? ?Physical Exam: ?Physical Exam ?Vitals and nursing note reviewed.  ?Constitutional:   ?   Appearance: Normal appearance.  ?HENT:  ?   Head: Normocephalic and atraumatic.  ?   Right Ear: External ear normal.  ?   Left Ear: External ear normal.  ?   Nose: Nose normal.  ?Cardiovascular:  ?   Rate and Rhythm: Tachycardia present.   ?Pulmonary:  ?   Effort: Pulmonary effort is normal.  ?Musculoskeletal:     ?   General: Normal range of motion.  ?   Cervical back: Normal range of motion and neck supple.  ?Neurological:  ?   General: No foc

## 2021-05-13 NOTE — ED Notes (Signed)
Pt. Alert and oriented, warm and dry, in no distress. Pt. Denies SI, HI, and AVH. Pt states she has not been able to sleep since Saturday. Patient states she currently lives with her daughter's dad and it has not been the best move but she states she did it for her daughter and her father to have a relationship. But due to the stress she is under it has became very over whelming and has so much going on between daughter's senior year in high school and and other stressors she can not sleep. Patient states to has tried over the counter meds that have not helped. Pt. Encouraged to let nursing staff know of any concerns or needs. ? ?

## 2021-05-13 NOTE — ED Notes (Signed)
ED Provider at bedside. 

## 2021-05-13 NOTE — BH Assessment (Signed)
Patient has been psyc cleared per Psyc NP Elenore Paddy and recommended for Outpatient follow-up. Patient was provided with a list of therapists in her area that accept her insurance. Patient was receptive and had no further questions. ?

## 2021-05-13 NOTE — ED Triage Notes (Addendum)
Pt presents via POV with complaints of insomnia for several days- pt tearful in triage. She endorses some stressors in her life pertaining to a relationship which she believes is contributing to her lack of sleep. Pt tried OTC medications for insomnia which hasn't helped. Denies SI/HI.  ?

## 2021-06-05 ENCOUNTER — Other Ambulatory Visit: Payer: Self-pay | Admitting: Obstetrics and Gynecology

## 2021-06-05 DIAGNOSIS — A6004 Herpesviral vulvovaginitis: Secondary | ICD-10-CM

## 2021-10-27 ENCOUNTER — Other Ambulatory Visit: Payer: Self-pay | Admitting: Obstetrics and Gynecology

## 2021-10-27 ENCOUNTER — Telehealth: Payer: Self-pay | Admitting: Obstetrics and Gynecology

## 2021-10-27 ENCOUNTER — Other Ambulatory Visit: Payer: Self-pay

## 2021-10-27 DIAGNOSIS — A6004 Herpesviral vulvovaginitis: Secondary | ICD-10-CM

## 2021-10-27 MED ORDER — VALACYCLOVIR HCL 500 MG PO TABS: ORAL_TABLET | ORAL | 0 refills | Status: DC

## 2021-10-27 NOTE — Telephone Encounter (Signed)
Patient is requesting an refill on her valACYclovir (VALTREX) 500 MG tablet.  Please advise?

## 2021-10-27 NOTE — Telephone Encounter (Signed)
Done! sent to [facility].

## 2021-10-27 NOTE — Telephone Encounter (Signed)
I contacted patient and she is aware.

## 2021-11-03 ENCOUNTER — Other Ambulatory Visit: Payer: Self-pay | Admitting: Obstetrics and Gynecology

## 2021-11-03 DIAGNOSIS — A6004 Herpesviral vulvovaginitis: Secondary | ICD-10-CM

## 2022-01-13 ENCOUNTER — Other Ambulatory Visit: Payer: Self-pay | Admitting: Obstetrics and Gynecology

## 2022-01-13 DIAGNOSIS — A6004 Herpesviral vulvovaginitis: Secondary | ICD-10-CM

## 2022-02-09 ENCOUNTER — Other Ambulatory Visit: Payer: Self-pay | Admitting: Obstetrics and Gynecology

## 2022-02-09 DIAGNOSIS — A6004 Herpesviral vulvovaginitis: Secondary | ICD-10-CM

## 2022-02-20 ENCOUNTER — Other Ambulatory Visit: Payer: Self-pay | Admitting: Obstetrics and Gynecology

## 2022-02-20 DIAGNOSIS — A6004 Herpesviral vulvovaginitis: Secondary | ICD-10-CM

## 2022-04-21 ENCOUNTER — Other Ambulatory Visit: Payer: Self-pay | Admitting: Obstetrics and Gynecology

## 2022-04-21 DIAGNOSIS — Z3044 Encounter for surveillance of vaginal ring hormonal contraceptive device: Secondary | ICD-10-CM

## 2022-04-27 ENCOUNTER — Other Ambulatory Visit: Payer: Self-pay | Admitting: Obstetrics and Gynecology

## 2022-04-27 DIAGNOSIS — Z3044 Encounter for surveillance of vaginal ring hormonal contraceptive device: Secondary | ICD-10-CM

## 2022-04-28 ENCOUNTER — Telehealth: Payer: Self-pay

## 2022-04-28 NOTE — Telephone Encounter (Signed)
VM left on triage line to send RX of BC I noticed she has 3 refills at pharmacy , called to touch base.

## 2022-04-30 ENCOUNTER — Other Ambulatory Visit: Payer: Self-pay

## 2022-04-30 DIAGNOSIS — Z3044 Encounter for surveillance of vaginal ring hormonal contraceptive device: Secondary | ICD-10-CM

## 2022-04-30 MED ORDER — ETONOGESTREL-ETHINYL ESTRADIOL 0.12-0.015 MG/24HR VA RING: VAGINAL_RING | VAGINAL | 1 refills | Status: DC

## 2022-05-20 ENCOUNTER — Telehealth: Payer: Self-pay

## 2022-05-20 DIAGNOSIS — Z131 Encounter for screening for diabetes mellitus: Secondary | ICD-10-CM

## 2022-05-20 DIAGNOSIS — Z1329 Encounter for screening for other suspected endocrine disorder: Secondary | ICD-10-CM

## 2022-05-20 DIAGNOSIS — L659 Nonscarring hair loss, unspecified: Secondary | ICD-10-CM

## 2022-05-20 DIAGNOSIS — Z0000 Encounter for general adult medical examination without abnormal findings: Secondary | ICD-10-CM

## 2022-05-20 DIAGNOSIS — Z113 Encounter for screening for infections with a predominantly sexual mode of transmission: Secondary | ICD-10-CM

## 2022-05-20 NOTE — Telephone Encounter (Signed)
Lab orders placed.  

## 2022-05-20 NOTE — Telephone Encounter (Signed)
Pt calling; needs refill of bc to get her to her appt on 06/02/22.  [phone]  Pt aware she should have refills at the pharm.  Pt is also requesting to have blood work drawn before her appt c [facility] - needs CBC, Thyroid, Hgb A1c, CMP as well as others [facility] wants for her to have done.  The labs mentioned are for someone else who specifically wants those drawn. Pt aware [facility] is not in the office today but is in tomorrow and I will send msg to schedulers to schedule pt for lab draw.

## 2022-05-21 ENCOUNTER — Other Ambulatory Visit: Payer: Managed Care, Other (non HMO)

## 2022-05-21 DIAGNOSIS — Z113 Encounter for screening for infections with a predominantly sexual mode of transmission: Secondary | ICD-10-CM

## 2022-05-21 DIAGNOSIS — L659 Nonscarring hair loss, unspecified: Secondary | ICD-10-CM

## 2022-05-21 DIAGNOSIS — Z1329 Encounter for screening for other suspected endocrine disorder: Secondary | ICD-10-CM

## 2022-05-21 DIAGNOSIS — Z0000 Encounter for general adult medical examination without abnormal findings: Secondary | ICD-10-CM

## 2022-05-21 DIAGNOSIS — Z131 Encounter for screening for diabetes mellitus: Secondary | ICD-10-CM

## 2022-05-21 NOTE — Addendum Note (Signed)
Addended by: Ardeth Perfect B on: [id number] 10:34 AM   Modules accepted: Orders

## 2022-05-26 ENCOUNTER — Encounter: Payer: Self-pay | Admitting: Obstetrics and Gynecology

## 2022-05-28 LAB — CBC WITH DIFFERENTIAL/PLATELET
Basophils Absolute: 0.1 10*3/uL (ref 0.0–0.2)
Basos: 1 %
EOS (ABSOLUTE): 0.2 10*3/uL (ref 0.0–0.4)
Eos: 3 %
Hematocrit: 39.7 % (ref 34.0–46.6)
Hemoglobin: 12.6 g/dL (ref 11.1–15.9)
Immature Grans (Abs): 0 10*3/uL (ref 0.0–0.1)
Immature Granulocytes: 1 %
Lymphocytes Absolute: 2.2 10*3/uL (ref 0.7–3.1)
Lymphs: 33 %
MCH: 27 pg (ref 26.6–33.0)
MCHC: 31.7 g/dL (ref 31.5–35.7)
MCV: 85 fL (ref 79–97)
Monocytes Absolute: 0.6 10*3/uL (ref 0.1–0.9)
Monocytes: 9 %
Neutrophils Absolute: 3.6 10*3/uL (ref 1.4–7.0)
Neutrophils: 53 %
Platelets: 318 10*3/uL (ref 150–450)
RBC: 4.67 x10E6/uL (ref 3.77–5.28)
RDW: 13.3 % (ref 11.7–15.4)
WBC: 6.6 10*3/uL (ref 3.4–10.8)

## 2022-05-28 LAB — HEMOGLOBIN A1C
Est. average glucose Bld gHb Est-mCnc: 117 mg/dL
Hgb A1c MFr Bld: 5.7 % — ABNORMAL HIGH (ref 4.8–5.6)

## 2022-05-28 LAB — COMPREHENSIVE METABOLIC PANEL
ALT: 21 IU/L (ref 0–32)
AST: 19 IU/L (ref 0–40)
Albumin/Globulin Ratio: 1.4 (ref 1.2–2.2)
Albumin: 4 g/dL (ref 3.9–4.9)
Alkaline Phosphatase: 63 IU/L (ref 44–121)
BUN/Creatinine Ratio: 19 (ref 9–23)
BUN: 17 mg/dL (ref 6–24)
Bilirubin Total: 0.2 mg/dL (ref 0.0–1.2)
CO2: 23 mmol/L (ref 20–29)
Calcium: 9.2 mg/dL (ref 8.7–10.2)
Chloride: 104 mmol/L (ref 96–106)
Creatinine, Ser: 0.89 mg/dL (ref 0.57–1.00)
Globulin, Total: 2.8 g/dL (ref 1.5–4.5)
Glucose: 82 mg/dL (ref 70–99)
Potassium: 4.5 mmol/L (ref 3.5–5.2)
Sodium: 139 mmol/L (ref 134–144)
Total Protein: 6.8 g/dL (ref 6.0–8.5)
eGFR: 80 mL/min/{1.73_m2} (ref >59)

## 2022-05-28 LAB — TESTOSTERONE,FREE AND TOTAL
Testosterone, Free: 0.2 pg/mL (ref 0.0–4.2)
Testosterone: 11 ng/dL (ref 4–50)

## 2022-05-28 LAB — DHEA: Dehydroepiandrosterone (DHEA): 202 ng/dL (ref 31–701)

## 2022-05-28 LAB — FERRITIN: Ferritin: 37 ng/mL (ref 15–150)

## 2022-05-28 LAB — HIV ANTIBODY (ROUTINE TESTING W REFLEX): HIV Screen 4th Generation wRfx: NONREACTIVE

## 2022-05-28 LAB — TSH: TSH: 0.936 u[IU]/mL (ref 0.450–4.500)

## 2022-05-28 LAB — RPR QUALITATIVE: RPR Ser Ql: NONREACTIVE

## 2022-05-28 LAB — T4, FREE: Free T4: 1.51 ng/dL (ref 0.82–1.77)

## 2022-06-01 NOTE — Progress Notes (Signed)
No chief complaint on file.   HPI:      [name] is a 47 y.o. G3P1011 who LMP was No LMP recorded., presents today for her annual examination.  Her menses are regular every 28-30 days, lasting 4 days, light on nuvaring now.  Dysmenorrhea none. She does not have intermenstrual bleeding. No vasomotor sx.  Sex activity:  single partner, contraception - nuvaring. Was diagnosed with trich 1/23 and treated with flagyl, partner also treated. Needs test of cure. She would like full lab STD testing    Has been having ext vaginal itching recently. Treated with diflucan without sx relief. No increased d/c, odor. Has changed soap and now using scented dryer sheets.   Last Pap: 04/16/21, 10/19/19 and 04/20/18  Results were: no abnormalities /neg HPV DNA. POS HPV DNA 2019.  Will do Q3 yrs per [facility].  Hx of STDs: chlamydia, HSV, HPV, trich. She takes valtrex prn outbreaks and needs Rx RF.   Last mammogram: 11/13/20  Results: normal, repeat in 12 months. Benign cyst LT breast There is a FH of breast cancer in her mat aunt and MGM, genetic testing not indicated. There is no FH of ovarian cancer. The patient  does self-breast exams.  Tobacco use: The patient denies current or previous tobacco use. Alcohol use: none No drug use Exercise: moderately active  She does not get adequate calcium or Vitamin D in her diet.  She had neg STD labs/lipids 8/21. Slightly elevated HgA1C 3/24. Does labs for hair loss disorder and gives to derm--done 3/24.  Colonoscopy: 1/22 at [facility]; repeat due after 10 yrs per pt  Past Medical History:  Diagnosis Date   Acne    Bacterial vaginitis    Candida vaginitis    Cervical high risk human papillomavirus (HPV) DNA test positive 2018   Genital herpes    Hair loss disorder    History of Papanicolaou smear of cervix 10/27/2010   -/-/gc neg   Seasonal allergies    Trichomoniasis     Past Surgical History:  Procedure Laterality Date   CESAREAN SECTION   10/11/2003    Family History  Problem Relation Age of Onset   Hypertension Mother    Cancer Mother        uterine vs cx dysplasia (had hyst)   Colon polyps Father 32       tested and polyps were removed   Breast cancer Maternal Aunt 55   Liver disease Maternal Grandmother    Bone cancer Paternal Grandmother    Lung cancer Paternal Grandfather     Social History   Socioeconomic History   Marital status: Single    Spouse name: Not on file   Number of children: Not on file   Years of education: Not on file   Highest education level: Not on file  Occupational History   Not on file  Tobacco Use   Smoking status: Never   Smokeless tobacco: Never  Vaping Use   Vaping Use: Never used  Substance and Sexual Activity   Alcohol use: No   Drug use: No   Sexual activity: Yes    Birth control/protection: Condom, Inserts    Comment: Nuvaring  Other Topics Concern   Not on file  Social History Narrative   Not on file   Social Determinants of Health   Financial Resource Strain: Not on file  Food Insecurity: Not on file  Transportation Needs: Not on file  Physical Activity: Not on file  Stress: Not on file  Social Connections: Not on file  Intimate Partner Violence: Not on file     Current Outpatient Medications:    etonogestrel-ethinyl estradiol (NUVARING) 0.12-0.015 MG/24HR vaginal ring, Insert vaginally and leave in place for 3 consecutive weeks, then remove for 1 week., Disp: 3 each, Rfl: 1   hydrOXYzine (ATARAX) 25 MG tablet, Take 1 tablet (25 mg total) by mouth 3 (three) times daily as needed for anxiety., Disp: 20 tablet, Rfl: 0   metroNIDAZOLE (FLAGYL) 500 MG tablet, Take 1 tab BID for 7 days; NO alcohol use for 10 days after prescription start, Disp: 14 tablet, Rfl: 0   traZODone (DESYREL) 100 MG tablet, Take 1 tablet (100 mg total) by mouth at bedtime as needed for sleep., Disp: 30 tablet, Rfl: 1   valACYclovir (VALTREX) 500 MG tablet, TAKE 1 TABLET BY MOUTH TWICE A  DAY FOR 3 DAYS AS NEEDED, Disp: 30 tablet, Rfl: 2  ROS:  Review of Systems  Constitutional:  Negative for fatigue, fever and unexpected weight change.  Respiratory:  Negative for cough, shortness of breath and wheezing.   Cardiovascular:  Negative for chest pain, palpitations and leg swelling.  Gastrointestinal:  Negative for blood in stool, constipation, diarrhea, nausea and vomiting.  Endocrine: Negative for cold intolerance, heat intolerance and polyuria.  Genitourinary:  Negative for dyspareunia, dysuria, flank pain, frequency, genital sores, hematuria, menstrual problem, pelvic pain, urgency, vaginal bleeding, vaginal discharge and vaginal pain.  Musculoskeletal:  Negative for back pain, joint swelling and myalgias.  Skin:  Negative for rash.  Neurological:  Negative for dizziness, syncope, light-headedness, numbness and headaches.  Hematological:  Negative for adenopathy.  Psychiatric/Behavioral:  Negative for agitation, confusion, sleep disturbance and suicidal ideas. The patient is not nervous/anxious.      Objective: There were no vitals taken for this visit.   Physical Exam Constitutional:      Appearance: She is well-developed.  Genitourinary:     Vulva normal.     Genitourinary Comments: NEG EXT VAG EXAM     Right Labia: No rash, tenderness or lesions.    Left Labia: No tenderness, lesions or rash.    No vaginal discharge, erythema or tenderness.      Right Adnexa: not tender and no mass present.    Left Adnexa: not tender and no mass present.    No cervical friability or polyp.     Uterus is not enlarged or tender.  Breasts:    Right: No mass, nipple discharge, skin change or tenderness.     Left: No mass, nipple discharge, skin change or tenderness.  Neck:     Thyroid: No thyromegaly.  Cardiovascular:     Rate and Rhythm: Normal rate and regular rhythm.     Heart sounds: Normal heart sounds. No murmur heard. Pulmonary:     Effort: Pulmonary effort is  normal.     Breath sounds: Normal breath sounds.  Abdominal:     Palpations: Abdomen is soft.     Tenderness: There is no abdominal tenderness. There is no guarding or rebound.  Musculoskeletal:        General: Normal range of motion.     Cervical back: Normal range of motion.  Lymphadenopathy:     Cervical: No cervical adenopathy.  Neurological:     General: No focal deficit present.     Mental Status: She is alert and oriented to person, place, and time.     Cranial Nerves: No cranial nerve deficit.  Skin:  General: Skin is warm and dry.  Psychiatric:        Mood and Affect: Mood normal.        Behavior: Behavior normal.        Thought Content: Thought content normal.        Judgment: Judgment normal.  Vitals reviewed.    No results found for this or any previous visit (from the past 24 hour(s)).   Assessment/Plan: Encounter for annual routine gynecological examination  Cervical cancer screening - Plan: Cytology - PAP,   Cervical high risk HPV (human papillomavirus) test positive - Plan: Cytology - PAP,   Screening for HPV (human papillomavirus) - Plan: Cytology - PAP,   Trichomoniasis - Plan: NuSwab Vaginitis Plus (VG+),   Screening for STD (sexually transmitted disease) - Plan: HIV Antibody (routine testing w rflx), RPR, Hepatitis C antibody, NuSwab Vaginitis Plus (VG+),   Encounter for surveillance of vaginal ring hormonal contraceptive device - Plan: etonogestrel-ethinyl estradiol (NUVARING) 0.12-0.015 MG/24HR vaginal ring; nuvaring RF eRxd.   Encounter for screening mammogram for malignant neoplasm of breast - Plan: MM 3D SCREEN BREAST BILATERAL; pt current on mammo till 9/23  Herpes simplex vulvovaginitis - Plan: valACYclovir (VALTREX) 500 MG tablet; Rx RF prn sx  Hair loss disorder - Plan: TSH + free T4, Testosterone,Free and Total, Ferritin, DHEA  Vaginal itching - Plan: NuSwab Vaginitis Plus (VG+), POCT Wet Prep with KOH, neg wet prep/exam. Rule out  infection with culture. If neg, most likely chem derm. Dove sens skin soap/line dry underwear, OTC hydrocortisone crm ext prn. F/u prn.    No orders of the defined types were placed in this encounter.              GYN counsel mammography screening, adequate intake of calcium and vitamin D     F/U  No follow-ups on file.  Justen Fonda B. Cintya Daughety, PA-C 06/01/2022 5:01 PM

## 2022-06-02 ENCOUNTER — Encounter: Payer: Self-pay | Admitting: Obstetrics and Gynecology

## 2022-06-02 ENCOUNTER — Other Ambulatory Visit (HOSPITAL_COMMUNITY)
Admission: RE | Admit: 2022-06-02 | Discharge: 2022-06-02 | Disposition: A | Discharge status: Home / Self Care | Payer: Managed Care, Other (non HMO) | Source: Ambulatory Visit | Attending: Obstetrics and Gynecology | Admitting: Obstetrics and Gynecology

## 2022-06-02 ENCOUNTER — Ambulatory Visit (INDEPENDENT_AMBULATORY_CARE_PROVIDER_SITE_OTHER): Payer: Managed Care, Other (non HMO) | Admitting: Obstetrics and Gynecology

## 2022-06-02 VITALS — BP 110/60 | Ht 66.5 in | Wt 203.0 lb

## 2022-06-02 DIAGNOSIS — N644 Mastodynia: Secondary | ICD-10-CM

## 2022-06-02 DIAGNOSIS — Z1231 Encounter for screening mammogram for malignant neoplasm of breast: Secondary | ICD-10-CM

## 2022-06-02 DIAGNOSIS — Z01419 Encounter for gynecological examination (general) (routine) without abnormal findings: Secondary | ICD-10-CM

## 2022-06-02 DIAGNOSIS — A6004 Herpesviral vulvovaginitis: Secondary | ICD-10-CM

## 2022-06-02 DIAGNOSIS — Z113 Encounter for screening for infections with a predominantly sexual mode of transmission: Secondary | ICD-10-CM

## 2022-06-02 DIAGNOSIS — N926 Irregular menstruation, unspecified: Secondary | ICD-10-CM

## 2022-06-02 DIAGNOSIS — Z3202 Encounter for pregnancy test, result negative: Secondary | ICD-10-CM | POA: Diagnosis not present

## 2022-06-02 DIAGNOSIS — Z3044 Encounter for surveillance of vaginal ring hormonal contraceptive device: Secondary | ICD-10-CM

## 2022-06-02 LAB — POCT URINE PREGNANCY: Preg Test, Ur: NEGATIVE

## 2022-06-02 MED ORDER — ETONOGESTREL-ETHINYL ESTRADIOL 0.12-0.015 MG/24HR VA RING: VAGINAL_RING | VAGINAL | 3 refills | Status: DC

## 2022-06-02 MED ORDER — VALACYCLOVIR HCL 500 MG PO TABS: 500.0000 mg | ORAL_TABLET | Freq: Two times a day (BID) | ORAL | 0 refills | Status: DC

## 2022-06-02 NOTE — Patient Instructions (Addendum)
I value your feedback and you entrusting us with your care. If you get a [facility] patient survey, I would appreciate you taking the time to let us know about your experience today. Thank you!  [facility]: [phone]      

## 2022-06-04 LAB — CERVICOVAGINAL ANCILLARY ONLY
Chlamydia: NEGATIVE
Comment: NEGATIVE
Comment: NEGATIVE
Comment: NORMAL
Neisseria Gonorrhea: NEGATIVE
Trichomonas: NEGATIVE

## 2022-07-11 ENCOUNTER — Ambulatory Visit
Admission: RE | Admit: 2022-07-11 | Discharge: 2022-07-11 | Disposition: A | Discharge status: Home / Self Care | Payer: Managed Care, Other (non HMO) | Source: Ambulatory Visit | Attending: Obstetrics and Gynecology | Admitting: Obstetrics and Gynecology

## 2022-07-11 DIAGNOSIS — Z1231 Encounter for screening mammogram for malignant neoplasm of breast: Secondary | ICD-10-CM

## 2022-07-11 LAB — AMB RESULTS CONSOLE CBG: Glucose: 89

## 2022-07-11 NOTE — Progress Notes (Signed)
Catch 5 in 5. Pt has pcp. No SDOH needs at this time

## 2022-07-16 ENCOUNTER — Encounter: Payer: Self-pay | Admitting: *Deleted

## 2022-07-16 NOTE — Progress Notes (Signed)
Pt attended screening event on 06/02/22 at [facility], where Pt screening result were 129/89 and Glucose 89. At the event, Pt did not share PCP info and no SDOH was indicated. Per chart review, Pt PCP is listed as Ilona Sorrel, [place], [place] and was last seen by PCP on 06/02/2022. Pt screening results was sent to PCP through [employer]. No additional health equity team support indicated at this time. Lorri Frederick, CCG

## 2022-12-12 ENCOUNTER — Other Ambulatory Visit: Payer: Self-pay | Admitting: Obstetrics and Gynecology

## 2022-12-12 DIAGNOSIS — Z3044 Encounter for surveillance of vaginal ring hormonal contraceptive device: Secondary | ICD-10-CM

## 2023-01-14 ENCOUNTER — Ambulatory Visit: Payer: BC Managed Care – PPO

## 2023-01-14 DIAGNOSIS — Z719 Counseling, unspecified: Secondary | ICD-10-CM

## 2023-01-14 DIAGNOSIS — Z23 Encounter for immunization: Secondary | ICD-10-CM | POA: Diagnosis not present

## 2023-01-14 NOTE — Progress Notes (Signed)
Pt in nurse clinic requesting flu vaccine.  See immunization flowsheet.  VIS given.    Flu vaccine given IM and tolerated well.    Updated [facility] provided.  Cherlynn Polo, RN

## 2023-01-15 ENCOUNTER — Other Ambulatory Visit: Payer: Self-pay | Admitting: Obstetrics and Gynecology

## 2023-01-15 DIAGNOSIS — A6004 Herpesviral vulvovaginitis: Secondary | ICD-10-CM

## 2023-02-12 ENCOUNTER — Other Ambulatory Visit: Payer: Self-pay | Admitting: Obstetrics and Gynecology

## 2023-02-12 DIAGNOSIS — Z3044 Encounter for surveillance of vaginal ring hormonal contraceptive device: Secondary | ICD-10-CM

## 2023-02-14 ENCOUNTER — Other Ambulatory Visit: Payer: Self-pay | Admitting: Obstetrics and Gynecology

## 2023-02-14 DIAGNOSIS — Z3044 Encounter for surveillance of vaginal ring hormonal contraceptive device: Secondary | ICD-10-CM

## 2023-04-09 ENCOUNTER — Other Ambulatory Visit: Payer: Self-pay | Admitting: Obstetrics and Gynecology

## 2023-04-09 DIAGNOSIS — Z1231 Encounter for screening mammogram for malignant neoplasm of breast: Secondary | ICD-10-CM

## 2023-04-23 DIAGNOSIS — Z131 Encounter for screening for diabetes mellitus: Secondary | ICD-10-CM | POA: Diagnosis not present

## 2023-04-23 DIAGNOSIS — Z1389 Encounter for screening for other disorder: Secondary | ICD-10-CM | POA: Diagnosis not present

## 2023-04-23 DIAGNOSIS — D649 Anemia, unspecified: Secondary | ICD-10-CM | POA: Diagnosis not present

## 2023-04-23 DIAGNOSIS — Z0000 Encounter for general adult medical examination without abnormal findings: Secondary | ICD-10-CM | POA: Diagnosis not present

## 2023-04-23 DIAGNOSIS — Z13220 Encounter for screening for lipoid disorders: Secondary | ICD-10-CM | POA: Diagnosis not present

## 2023-04-23 DIAGNOSIS — Z1159 Encounter for screening for other viral diseases: Secondary | ICD-10-CM | POA: Diagnosis not present

## 2023-06-01 DIAGNOSIS — R7303 Prediabetes: Secondary | ICD-10-CM | POA: Insufficient documentation

## 2023-06-01 NOTE — Progress Notes (Deleted)
 No chief complaint on file.   HPI:      [name] is a 48 y.o. G3P1011 who LMP was No LMP recorded. (Menstrual status: Irregular Periods)., presents today for her annual examination.  Her menses are usually regular every 28-30 days, lasting 2-3 days, light on nuvaring now. Skipped LMP but did have breast tenderness. Neg UPT. Likes nuvaring for mood changes with menses, too. Dysmenorrhea none. She does not have intermenstrual bleeding.   Sex activity:  occas sexually active, contraception - nuvaring. No pain/bleeding/dryness. Would like STD testing today. Did blood STD testing 3/24.   Last Pap: 04/16/21, 10/19/19 and 04/20/18  Results were: no abnormalities /neg HPV DNA. POS HPV DNA 2019.  Will do Q3 yrs per [facility].  Hx of STDs: chlamydia, HSV, HPV, trich. She takes valtrex prn outbreaks and needs Rx RF.   Last mammogram: 07/11/22  Results: normal, repeat in 12 months. Benign cyst LT breast There is a FH of breast cancer in her mat aunt and MGM, genetic testing not indicated. There is no FH of ovarian cancer. The patient  does self-breast exams. Has had breast tenderness this cycle, even without bleeding on placebo wk. Drinking lots of caffeine. Sx usual for pt but more this month.  Tobacco use: The patient denies current or previous tobacco use. Alcohol use: none No drug use Exercise: moderately active  She does not get adequate calcium or Vitamin D in her diet.  She had neg STD labs 3/24/ neg lipids 8/21. Slightly elevated HgA1C 3/24. Does labs for hair loss disorder and gives to derm--done 3/24.  Colonoscopy: 1/22 at [facility]; repeat due after 10 yrs per pt  Past Medical History:  Diagnosis Date  . Acne   . Bacterial vaginitis   . Candida vaginitis   . Cervical high risk human papillomavirus (HPV) DNA test positive 2018  . Genital herpes   . Hair loss disorder   . History of Papanicolaou smear of cervix 10/27/2010   -/-/gc neg  . Seasonal allergies   . Trichomoniasis      Past Surgical History:  Procedure Laterality Date  . CESAREAN SECTION  10/11/2003    Family History  Problem Relation Age of Onset  . Hypertension Mother   . Cancer Mother        uterine vs cx dysplasia (had hyst)  . Colon polyps Father 85       tested and polyps were removed  . Breast cancer Maternal Aunt 55  . Liver disease Maternal Grandmother   . Bone cancer Paternal Grandmother   . Lung cancer Paternal Grandfather     Social History   Socioeconomic History  . Marital status: Single    Spouse name: Not on file  . Number of children: Not on file  . Years of education: Not on file  . Highest education level: Not on file  Occupational History  . Not on file  Tobacco Use  . Smoking status: Never  . Smokeless tobacco: Never  Vaping Use  . Vaping status: Never Used  Substance and Sexual Activity  . Alcohol use: No  . Drug use: No  . Sexual activity: Not Currently    Birth control/protection: Inserts    Comment: Nuvaring  Other Topics Concern  . Not on file  Social History Narrative  . Not on file   Social Drivers of Health   Financial Resource Strain: Not on file  Food Insecurity: No Food Insecurity (07/11/2022)   Hunger Vital Sign   .  Worried About Programme researcher, broadcasting/film/video in the Last Year: Never true   . Ran Out of Food in the Last Year: Never true  Transportation Needs: No Transportation Needs (07/11/2022)   PRAPARE - Transportation   . Lack of Transportation (Medical): No   . Lack of Transportation (Non-Medical): No  Physical Activity: Not on file  Stress: Not on file  Social Connections: Not on file  Intimate Partner Violence: Not At Risk (07/11/2022)   Humiliation, Afraid, Rape, and Kick questionnaire   . Fear of Current or Ex-Partner: No   . Emotionally Abused: No   . Physically Abused: No   . Sexually Abused: No     Current Outpatient Medications:  .  etonogestrel-ethinyl estradiol (NUVARING) 0.12-0.015 MG/24HR vaginal ring, INSERT 1 RING  VAGINALLY AS DIRECTED. REMOVE AFTER 3 WEEKS & WAIT 7 DAYS BEFORE INSERTING A NEW RING, Disp: 1 each, Rfl: 5 .  valACYclovir (VALTREX) 500 MG tablet, TAKE 1 TABLET (500 MG TOTAL) BY MOUTH 2 (TWO) TIMES DAILY. FOR 3 DAYS AS NEEDED, Disp: 180 tablet, Rfl: 1  ROS:  Review of Systems  Constitutional:  Negative for fatigue, fever and unexpected weight change.  Respiratory:  Negative for cough, shortness of breath and wheezing.   Cardiovascular:  Negative for chest pain, palpitations and leg swelling.  Gastrointestinal:  Negative for blood in stool, constipation, diarrhea, nausea and vomiting.  Endocrine: Negative for cold intolerance, heat intolerance and polyuria.  Genitourinary:  Negative for dyspareunia, dysuria, flank pain, frequency, genital sores, hematuria, menstrual problem, pelvic pain, urgency, vaginal bleeding, vaginal discharge and vaginal pain.  Musculoskeletal:  Negative for back pain, joint swelling and myalgias.  Skin:  Negative for rash.  Neurological:  Negative for dizziness, syncope, light-headedness, numbness and headaches.  Hematological:  Negative for adenopathy.  Psychiatric/Behavioral:  Negative for agitation, confusion, sleep disturbance and suicidal ideas. The patient is not nervous/anxious.      Objective: There were no vitals taken for this visit.   Physical Exam Constitutional:      Appearance: She is well-developed.  Genitourinary:     Vulva normal.     Right Labia: No rash, tenderness or lesions.    Left Labia: No tenderness, lesions or rash.    No vaginal discharge, erythema or tenderness.      Right Adnexa: not tender and no mass present.    Left Adnexa: not tender and no mass present.    No cervical friability or polyp.     Uterus is not enlarged or tender.  Breasts:    Right: No mass, nipple discharge, skin change or tenderness.     Left: No mass, nipple discharge, skin change or tenderness.  Neck:     Thyroid: No thyromegaly.  Cardiovascular:      Rate and Rhythm: Normal rate and regular rhythm.     Heart sounds: Normal heart sounds. No murmur heard. Pulmonary:     Effort: Pulmonary effort is normal.     Breath sounds: Normal breath sounds.  Abdominal:     Palpations: Abdomen is soft.     Tenderness: There is no abdominal tenderness. There is no guarding or rebound.  Musculoskeletal:        General: Normal range of motion.     Cervical back: Normal range of motion.  Lymphadenopathy:     Cervical: No cervical adenopathy.  Neurological:     General: No focal deficit present.     Mental Status: She is alert and oriented to person, place, and time.  Cranial Nerves: No cranial nerve deficit.  Skin:    General: Skin is warm and dry.  Psychiatric:        Mood and Affect: Mood normal.        Behavior: Behavior normal.        Thought Content: Thought content normal.        Judgment: Judgment normal.  Vitals reviewed.   No results found for this or any previous visit (from the past 24 hours).   Assessment/Plan: Encounter for annual routine gynecological examination  Screening for STD (sexually transmitted disease) - Plan: Cervicovaginal ancillary only  Encounter for surveillance of vaginal ring hormonal contraceptive device - Plan: etonogestrel-ethinyl estradiol (NUVARING) 0.12-0.015 MG/24HR vaginal ring; Rx RF. Discussed normal for menses to skip on nuvaring/her age. Reassurance. F/u prn. Neg UPT today  Breast tenderness - Plan: POCT urine pregnancy; neg UPT. D/c caffeine, reassurance. F/u prn.  Irregular menses - Plan: POCT urine pregnancy; neg UPT. Reassurance.   Encounter for screening mammogram for malignant neoplasm of breast - Plan: MM 3D SCREENING MAMMOGRAM BILATERAL BREAST; pt to schedule mammo  Herpes simplex vulvovaginitis - Plan: valACYclovir (VALTREX) 500 MG tablet; Rx RF.    No orders of the defined types were placed in this encounter.              GYN counsel mammography screening, adequate intake of  calcium and vitamin D     F/U  No follow-ups on file.  Hailei Besser B. Khali Albanese, PA-C 06/01/2023 1:49 PM

## 2023-06-03 ENCOUNTER — Ambulatory Visit: Payer: Managed Care, Other (non HMO) | Admitting: Obstetrics and Gynecology

## 2023-06-03 DIAGNOSIS — Z3044 Encounter for surveillance of vaginal ring hormonal contraceptive device: Secondary | ICD-10-CM

## 2023-06-03 DIAGNOSIS — Z0000 Encounter for general adult medical examination without abnormal findings: Secondary | ICD-10-CM

## 2023-06-03 DIAGNOSIS — Z1231 Encounter for screening mammogram for malignant neoplasm of breast: Secondary | ICD-10-CM

## 2023-06-03 DIAGNOSIS — A6004 Herpesviral vulvovaginitis: Secondary | ICD-10-CM

## 2023-06-03 DIAGNOSIS — R7303 Prediabetes: Secondary | ICD-10-CM

## 2023-06-03 DIAGNOSIS — Z01419 Encounter for gynecological examination (general) (routine) without abnormal findings: Secondary | ICD-10-CM

## 2023-06-25 ENCOUNTER — Other Ambulatory Visit: Payer: Self-pay | Admitting: Obstetrics and Gynecology

## 2023-06-25 DIAGNOSIS — A6004 Herpesviral vulvovaginitis: Secondary | ICD-10-CM

## 2023-06-30 NOTE — Progress Notes (Signed)
 Chief Complaint  Patient presents with   Gynecologic Exam    HPI:      [name] is a 48 y.o. G3P1011 who LMP was Patient's last menstrual period was 06/01/2023 (approximate)., presents today for her annual examination.  Her menses are usually regular every 28-30 days, lasting 1-2 days, spotting only on nuvaring now. Likes nuvaring for mood changes with menses, too. Dysmenorrhea none. She does not have intermenstrual bleeding.   Sex activity:  not sexually active, contraception - nuvaring. No pain/bleeding/dryness. No vag sx today. Would like full STD testing today.   Last Pap: 04/16/21, 10/19/19 and 04/20/18  Results were: no abnormalities /neg HPV DNA. POS HPV DNA 2019.  Will do Q3 yrs per [facility].  Hx of STDs: chlamydia, HSV, HPV, trich. She takes valtrex  prn outbreaks and doesn't need Rx RF today.   Last mammogram: 07/11/22  Results: normal, repeat in 12 months. Benign cyst LT breast There is a FH of breast cancer in her mat aunt and MGM, genetic testing not indicated. There is no FH of ovarian cancer. The patient  does self-breast exams.   Tobacco use: The patient denies current or previous tobacco use. Alcohol use: none No drug use Exercise: moderately active  She does not get adequate calcium or Vitamin D  in her diet.  She had neg STD labs 3/24/ neg lipids 8/21. Slightly elevated HgA1C 3/24. Does labs for hair loss disorder and gives to derm--done 3/24. Would like done today.   Colonoscopy: 1/22 at [facility]; repeat due after 10 yrs per pt  Past Medical History:  Diagnosis Date   Acne    Bacterial vaginitis    Candida vaginitis    Cervical high risk human papillomavirus (HPV) DNA test positive 2018   Genital herpes    Hair loss disorder    History of Papanicolaou smear of cervix 10/27/2010   -/-/gc neg   Seasonal allergies    Trichomoniasis     Past Surgical History:  Procedure Laterality Date   CESAREAN SECTION  10/11/2003    Family History  Problem  Relation Age of Onset   Hypertension Mother    Cancer Mother        uterine vs cx dysplasia (had hyst)   Colon polyps Father 72       tested and polyps were removed   Breast cancer Maternal Aunt 55   Liver disease Maternal Grandmother    Bone cancer Paternal Grandmother    Lung cancer Paternal Grandfather     Social History   Socioeconomic History   Marital status: Single    Spouse name: Not on file   Number of children: Not on file   Years of education: Not on file   Highest education level: Not on file  Occupational History   Not on file  Tobacco Use   Smoking status: Never   Smokeless tobacco: Never  Vaping Use   Vaping status: Never Used  Substance and Sexual Activity   Alcohol use: No   Drug use: No   Sexual activity: Not Currently    Birth control/protection: Inserts    Comment: Nuvaring  Other Topics Concern   Not on file  Social History Narrative   Not on file   Social Drivers of Health   Financial Resource Strain: Not on file  Food Insecurity: No Food Insecurity (07/11/2022)   Hunger Vital Sign    Worried About Running Out of Food in the Last Year: Never true  Ran Out of Food in the Last Year: Never true  Transportation Needs: No Transportation Needs (07/11/2022)   PRAPARE - Administrator, Civil Service (Medical): No    Lack of Transportation (Non-Medical): No  Physical Activity: Not on file  Stress: Not on file  Social Connections: Not on file  Intimate Partner Violence: Not At Risk (07/11/2022)   Humiliation, Afraid, Rape, and Kick questionnaire    Fear of Current or Ex-Partner: No    Emotionally Abused: No    Physically Abused: No    Sexually Abused: No     Current Outpatient Medications:    valACYclovir  (VALTREX ) 500 MG tablet, TAKE 1 TABLET (500 MG TOTAL) BY MOUTH 2 (TWO) TIMES DAILY. FOR 3 DAYS AS NEEDED, Disp: 180 tablet, Rfl: 1   etonogestrel -ethinyl estradiol  (NUVARING) 0.12-0.015 MG/24HR vaginal ring, INSERT 1 RING VAGINALLY  AS DIRECTED. REMOVE AFTER 3 WEEKS & WAIT 7 DAYS BEFORE INSERTING A NEW RING, Disp: 3 each, Rfl: 3  ROS:  Review of Systems  Constitutional:  Negative for fatigue, fever and unexpected weight change.  Respiratory:  Negative for cough, shortness of breath and wheezing.   Cardiovascular:  Negative for chest pain, palpitations and leg swelling.  Gastrointestinal:  Negative for blood in stool, constipation, diarrhea, nausea and vomiting.  Endocrine: Negative for cold intolerance, heat intolerance and polyuria.  Genitourinary:  Negative for dyspareunia, dysuria, flank pain, frequency, genital sores, hematuria, menstrual problem, pelvic pain, urgency, vaginal bleeding, vaginal discharge and vaginal pain.  Musculoskeletal:  Negative for back pain, joint swelling and myalgias.  Skin:  Negative for rash.  Neurological:  Negative for dizziness, syncope, light-headedness, numbness and headaches.  Hematological:  Negative for adenopathy.  Psychiatric/Behavioral:  Negative for agitation, confusion, sleep disturbance and suicidal ideas. The patient is not nervous/anxious.      Objective: BP 139/78 (BP Location: Right Arm, Patient Position: Sitting, Cuff Size: Normal)   Pulse 83   Ht 5' 6.5" (1.689 m)   Wt 186 lb 12.8 oz (84.7 kg)   LMP 06/01/2023 (Approximate)   BMI 29.70 kg/m    Physical Exam Constitutional:      Appearance: She is well-developed.  Genitourinary:     Vulva normal.     Right Labia: No rash, tenderness or lesions.    Left Labia: No tenderness, lesions or rash.    No vaginal discharge, erythema or tenderness.      Right Adnexa: not tender and no mass present.    Left Adnexa: not tender and no mass present.    No cervical friability or polyp.     Uterus is not enlarged or tender.  Breasts:    Right: No mass, nipple discharge, skin change or tenderness.     Left: No mass, nipple discharge, skin change or tenderness.  Neck:     Thyroid: No thyromegaly.  Cardiovascular:      Rate and Rhythm: Normal rate and regular rhythm.     Heart sounds: Normal heart sounds. No murmur heard. Pulmonary:     Effort: Pulmonary effort is normal.     Breath sounds: Normal breath sounds.  Abdominal:     Palpations: Abdomen is soft.     Tenderness: There is no abdominal tenderness. There is no guarding or rebound.  Musculoskeletal:        General: Normal range of motion.     Cervical back: Normal range of motion.  Lymphadenopathy:     Cervical: No cervical adenopathy.  Neurological:     General:  No focal deficit present.     Mental Status: She is alert and oriented to person, place, and time.     Cranial Nerves: No cranial nerve deficit.  Skin:    General: Skin is warm and dry.  Psychiatric:        Mood and Affect: Mood normal.        Behavior: Behavior normal.        Thought Content: Thought content normal.        Judgment: Judgment normal.  Vitals reviewed.    Assessment/Plan: Encounter for annual routine gynecological examination  Cervical cancer screening - Plan: Cytology - PAP  Screening for STD (sexually transmitted disease) - Plan: Cytology - PAP, HIV Antibody (routine testing w rflx), RPR Qual, Hepatitis C antibody  Screening for HPV (human papillomavirus) - Plan: Cytology - PAP  Encounter for surveillance of vaginal ring hormonal contraceptive device - Plan: etonogestrel -ethinyl estradiol  (NUVARING) 0.12-0.015 MG/24HR vaginal ring; Rx RF eRxd.   Encounter for screening mammogram for malignant neoplasm of breast - Plan: MM 3D SCREENING MAMMOGRAM BILATERAL BREAST; pt to schedule mammo  Herpes simplex vulvovaginitis-- will call for Rx RF prn  Hair loss disorder - Plan: Testosterone ,Free and Total, DHEA, Ferritin; check labs, pt will send to derm  Blood tests for routine general physical examination - Plan: CBC with Differential/Platelet, Comprehensive metabolic panel with GFR, TSH, T4, free, Hemoglobin A1c, Lipid panel  Thyroid disorder screening - Plan:  TSH, T4, free  Screening for diabetes mellitus - Plan: Hemoglobin A1c  Screening cholesterol level - Plan: Lipid panel    Meds ordered this encounter  Medications   etonogestrel -ethinyl estradiol  (NUVARING) 0.12-0.015 MG/24HR vaginal ring    Sig: INSERT 1 RING VAGINALLY AS DIRECTED. REMOVE AFTER 3 WEEKS & WAIT 7 DAYS BEFORE INSERTING A NEW RING    Dispense:  3 each    Refill:  3    DX Code Needed  .    Supervising Provider:   ROBY, MICIA [[id number]]               GYN counsel mammography screening, adequate intake of calcium and vitamin D      F/U  Return in about 1 year (around 06/30/2024).  Ladon Vandenberghe B. Darrion Wyszynski, PA-C 07/01/2023 8:53 AM

## 2023-07-01 ENCOUNTER — Encounter: Payer: Self-pay | Admitting: Obstetrics and Gynecology

## 2023-07-01 ENCOUNTER — Other Ambulatory Visit (HOSPITAL_COMMUNITY)
Admission: RE | Admit: 2023-07-01 | Discharge: 2023-07-01 | Disposition: A | Discharge status: Home / Self Care | Source: Ambulatory Visit | Attending: Obstetrics and Gynecology | Admitting: Obstetrics and Gynecology

## 2023-07-01 ENCOUNTER — Ambulatory Visit (INDEPENDENT_AMBULATORY_CARE_PROVIDER_SITE_OTHER): Admitting: Obstetrics and Gynecology

## 2023-07-01 VITALS — BP 139/78 | HR 83 | Ht 66.5 in | Wt 186.8 lb

## 2023-07-01 DIAGNOSIS — Z113 Encounter for screening for infections with a predominantly sexual mode of transmission: Secondary | ICD-10-CM

## 2023-07-01 DIAGNOSIS — Z01419 Encounter for gynecological examination (general) (routine) without abnormal findings: Secondary | ICD-10-CM | POA: Diagnosis not present

## 2023-07-01 DIAGNOSIS — Z3044 Encounter for surveillance of vaginal ring hormonal contraceptive device: Secondary | ICD-10-CM

## 2023-07-01 DIAGNOSIS — Z13220 Encounter for screening for lipoid disorders: Secondary | ICD-10-CM

## 2023-07-01 DIAGNOSIS — Z124 Encounter for screening for malignant neoplasm of cervix: Secondary | ICD-10-CM

## 2023-07-01 DIAGNOSIS — Z131 Encounter for screening for diabetes mellitus: Secondary | ICD-10-CM | POA: Diagnosis not present

## 2023-07-01 DIAGNOSIS — Z0000 Encounter for general adult medical examination without abnormal findings: Secondary | ICD-10-CM

## 2023-07-01 DIAGNOSIS — Z1329 Encounter for screening for other suspected endocrine disorder: Secondary | ICD-10-CM | POA: Diagnosis not present

## 2023-07-01 DIAGNOSIS — Z1151 Encounter for screening for human papillomavirus (HPV): Secondary | ICD-10-CM | POA: Diagnosis not present

## 2023-07-01 DIAGNOSIS — L659 Nonscarring hair loss, unspecified: Secondary | ICD-10-CM

## 2023-07-01 DIAGNOSIS — Z1231 Encounter for screening mammogram for malignant neoplasm of breast: Secondary | ICD-10-CM

## 2023-07-01 DIAGNOSIS — A6004 Herpesviral vulvovaginitis: Secondary | ICD-10-CM

## 2023-07-01 MED ORDER — ETONOGESTREL-ETHINYL ESTRADIOL 0.12-0.015 MG/24HR VA RING: VAGINAL_RING | VAGINAL | 3 refills | Status: AC

## 2023-07-01 NOTE — Patient Instructions (Signed)
 I value your feedback and you entrusting [place] with your care. If you get a [facility] patient survey, I would appreciate you taking the time to let us know about your experience today. Thank you!  [facility] ([place])--[phone]

## 2023-07-05 ENCOUNTER — Encounter: Payer: Self-pay | Admitting: Obstetrics and Gynecology

## 2023-07-05 LAB — CYTOLOGY - PAP
Chlamydia: NEGATIVE
Comment: NEGATIVE
Comment: NEGATIVE
Comment: NORMAL
Diagnosis: NEGATIVE
High risk HPV: POSITIVE — AB
Neisseria Gonorrhea: NEGATIVE

## 2023-07-06 ENCOUNTER — Encounter: Payer: Self-pay | Admitting: Obstetrics and Gynecology

## 2023-07-06 LAB — CBC WITH DIFFERENTIAL/PLATELET
Basophils Absolute: 0.1 10*3/uL (ref 0.0–0.2)
Basos: 1 %
EOS (ABSOLUTE): 0.3 10*3/uL (ref 0.0–0.4)
Eos: 4 %
Hematocrit: 38.1 % (ref 34.0–46.6)
Hemoglobin: 12.6 g/dL (ref 11.1–15.9)
Immature Grans (Abs): 0 10*3/uL (ref 0.0–0.1)
Immature Granulocytes: 0 %
Lymphocytes Absolute: 2.1 10*3/uL (ref 0.7–3.1)
Lymphs: 35 %
MCH: 28.1 pg (ref 26.6–33.0)
MCHC: 33.1 g/dL (ref 31.5–35.7)
MCV: 85 fL (ref 79–97)
Monocytes Absolute: 0.5 10*3/uL (ref 0.1–0.9)
Monocytes: 9 %
Neutrophils Absolute: 3 10*3/uL (ref 1.4–7.0)
Neutrophils: 51 %
Platelets: 263 10*3/uL (ref 150–450)
RBC: 4.48 x10E6/uL (ref 3.77–5.28)
RDW: 13.1 % (ref 11.7–15.4)
WBC: 5.9 10*3/uL (ref 3.4–10.8)

## 2023-07-06 LAB — COMPREHENSIVE METABOLIC PANEL WITH GFR
ALT: 14 IU/L (ref 0–32)
AST: 15 IU/L (ref 0–40)
Albumin: 4.2 g/dL (ref 3.9–4.9)
Alkaline Phosphatase: 59 IU/L (ref 44–121)
BUN/Creatinine Ratio: 14 (ref 9–23)
BUN: 13 mg/dL (ref 6–24)
Bilirubin Total: 0.3 mg/dL (ref 0.0–1.2)
CO2: 23 mmol/L (ref 20–29)
Calcium: 9.2 mg/dL (ref 8.7–10.2)
Chloride: 102 mmol/L (ref 96–106)
Creatinine, Ser: 0.94 mg/dL (ref 0.57–1.00)
Globulin, Total: 2.5 g/dL (ref 1.5–4.5)
Glucose: 82 mg/dL (ref 70–99)
Potassium: 4.1 mmol/L (ref 3.5–5.2)
Sodium: 136 mmol/L (ref 134–144)
Total Protein: 6.7 g/dL (ref 6.0–8.5)
eGFR: 75 mL/min/{1.73_m2} (ref >59)

## 2023-07-06 LAB — HEMOGLOBIN A1C
Est. average glucose Bld gHb Est-mCnc: 117 mg/dL
Hgb A1c MFr Bld: 5.7 % — ABNORMAL HIGH (ref 4.8–5.6)

## 2023-07-06 LAB — HEPATITIS C ANTIBODY: Hep C Virus Ab: NONREACTIVE

## 2023-07-06 LAB — HIV ANTIBODY (ROUTINE TESTING W REFLEX): HIV Screen 4th Generation wRfx: NONREACTIVE

## 2023-07-06 LAB — TESTOSTERONE,FREE AND TOTAL
Testosterone, Free: 0.5 pg/mL (ref 0.0–4.2)
Testosterone: 21 ng/dL (ref 4–50)

## 2023-07-06 LAB — LIPID PANEL
Chol/HDL Ratio: 2.2 ratio (ref 0.0–4.4)
Cholesterol, Total: 141 mg/dL (ref 100–199)
HDL: 65 mg/dL (ref >39)
LDL Chol Calc (NIH): 66 mg/dL (ref 0–99)
Triglycerides: 42 mg/dL (ref 0–149)
VLDL Cholesterol Cal: 10 mg/dL (ref 5–40)

## 2023-07-06 LAB — DHEA: Dehydroepiandrosterone (DHEA): 251 ng/dL (ref 31–701)

## 2023-07-06 LAB — TSH: TSH: 0.802 u[IU]/mL (ref 0.450–4.500)

## 2023-07-06 LAB — RPR QUALITATIVE: RPR Ser Ql: NONREACTIVE

## 2023-07-06 LAB — FERRITIN: Ferritin: 33 ng/mL (ref 15–150)

## 2023-07-06 LAB — T4, FREE: Free T4: 1.38 ng/dL (ref 0.82–1.77)

## 2023-07-15 ENCOUNTER — Ambulatory Visit: Payer: Self-pay | Admitting: Obstetrics and Gynecology

## 2023-07-15 NOTE — Progress Notes (Signed)
 The virus can lie dormant and there has to be enough present for the test to detect it, so that is why we check pap smears throughout a woman's lifetime. It can also be a false positive, hence why we repeat it in 1 year.  Williom Cedar

## 2023-07-28 DIAGNOSIS — L2389 Allergic contact dermatitis due to other agents: Secondary | ICD-10-CM | POA: Diagnosis not present

## 2023-09-27 DIAGNOSIS — H1131 Conjunctival hemorrhage, right eye: Secondary | ICD-10-CM | POA: Diagnosis not present

## 2023-11-05 DIAGNOSIS — H35039 Hypertensive retinopathy, unspecified eye: Secondary | ICD-10-CM | POA: Diagnosis not present

## 2023-11-05 DIAGNOSIS — H52223 Regular astigmatism, bilateral: Secondary | ICD-10-CM | POA: Diagnosis not present

## 2023-11-05 DIAGNOSIS — H524 Presbyopia: Secondary | ICD-10-CM | POA: Diagnosis not present

## 2024-01-07 ENCOUNTER — Encounter

## 2024-01-11 ENCOUNTER — Ambulatory Visit
Admission: RE | Admit: 2024-01-11 | Discharge: 2024-01-11 | Disposition: A | Discharge status: Home / Self Care | Source: Ambulatory Visit | Attending: Obstetrics and Gynecology | Admitting: Obstetrics and Gynecology

## 2024-01-11 DIAGNOSIS — Z1231 Encounter for screening mammogram for malignant neoplasm of breast: Secondary | ICD-10-CM | POA: Insufficient documentation

## 2024-01-20 NOTE — Progress Notes (Signed)
 Referring Physician:  No referring provider defined for this encounter.  Primary Physician:  Pcp, No  History of Present Illness: 01/25/2024 Melanie Griffin is here today with a chief complaint of longstanding neck and back pain that has become progressively worse over the past 2 years.  She states that she has neck pain that extends up the back of her head and into her shoulders, left worse than right.  It is constant in nature and she has been getting frequent headaches.  She endorses changes in her vision at times secondary to these headaches.  She feels as though she also has pain in her mid and low back.  At times her mid back pain radiates into the sides of her ribs.  At this point it is so painful and sensitive she is not able to wear a bra.  No saddle anesthesia or incontinence of bowel or bladder.  No changes to her gait.  Bowel/Bladder Dysfunction: none  Conservative measures: massages x 7 months every 2 weeks Physical therapy: has not participated in Multimodal medical therapy including regular antiinflammatories: Tylenol, Ibuprofen, Tizanidine   Injections: no epidural steroid injections  Past Surgery: no spine surgery  Melanie Griffin has no symptoms of cervical myelopathy.  The symptoms are causing a significant impact on the patient's life.   Review of Systems:  A 10 point review of systems is negative, except for the pertinent positives and negatives detailed in the HPI.  Past Medical History: Past Medical History:  Diagnosis Date   Acne    Bacterial vaginitis    Candida vaginitis    Cervical high risk human papillomavirus (HPV) DNA test positive 2018   Genital herpes    Hair loss disorder    History of Papanicolaou smear of cervix 10/27/2010   -/-/gc neg   Seasonal allergies    Trichomoniasis     Past Surgical History: Past Surgical History:  Procedure Laterality Date   CESAREAN SECTION  10/11/2003    Allergies: Allergies as of 01/25/2024 -  Review Complete 01/25/2024  Allergen Reaction Noted   Iodine  08/18/2016   Other  08/18/2016   Shellfish allergy  07/08/2016    Medications: Outpatient Encounter Medications as of 01/25/2024  Medication Sig   etonogestrel -ethinyl estradiol  (NUVARING) 0.12-0.015 MG/24HR vaginal ring INSERT 1 RING VAGINALLY AS DIRECTED. REMOVE AFTER 3 WEEKS & WAIT 7 DAYS BEFORE INSERTING A NEW RING   phentermine 37.5 MG capsule Take 37.5 mg by mouth every morning.   valACYclovir  (VALTREX ) 500 MG tablet TAKE 1 TABLET (500 MG TOTAL) BY MOUTH 2 (TWO) TIMES DAILY. FOR 3 DAYS AS NEEDED   No facility-administered encounter medications on file as of 01/25/2024.    Social History: Social History   Tobacco Use   Smoking status: Never   Smokeless tobacco: Never  Vaping Use   Vaping status: Never Used  Substance Use Topics   Alcohol use: No   Drug use: No    Family Medical History: Family History  Problem Relation Age of Onset   Hypertension Mother    Cancer Mother        uterine vs cx dysplasia (had hyst)   Colon polyps Father 62       tested and polyps were removed   Breast cancer Maternal Aunt 55   Liver disease Maternal Grandmother    Bone cancer Paternal Grandmother    Lung cancer Paternal Grandfather     Physical Examination: @VITALWITHPAIN @  General: Patient is well developed, well nourished, calm,  collected, and in no apparent distress. Attention to examination is appropriate.  Psychiatric: Patient is non-anxious.  Head:  Pupils equal, round, and reactive to light.  ENT:  Oral mucosa appears well hydrated.  Neck:   Supple.    Respiratory: Patient is breathing without any difficulty.  Extremities: No edema.  Vascular: Palpable dorsal pedal pulses.  Skin:   On exposed skin, there are no abnormal skin lesions.  NEUROLOGICAL:    Awake, alert, oriented to person, place, and time.  Speech is clear and fluent. Fund of knowledge is appropriate.   Cranial Nerves: Pupils equal  round and reactive to light.  Facial tone is symmetric.   ROM of spine: Tenderness palpation of cervical and thoracic paraspinals  Strength: Side Biceps Triceps Deltoid Interossei Grip Wrist Ext. Wrist Flex.  R 5 5 5 5 5 5 5   L 5 5 5 5 5 5 5     Reflexes are 2+ and symmetric at the biceps, triceps, brachioradialis.  Hoffman's is absent.  Gait is normal.   No difficulty with tandem gait.   No evidence of dysmetria noted.  Medical Decision Making  Imaging: No recent spine imaging  Assessment and Plan: Melanie Griffin is a pleasant 48 y.o. female  is here today with a chief complaint of longstanding neck and back pain that has become progressively worse over the past 2 years.  She states that she has neck pain that extends up the back of her head and into her shoulders, left worse than right.  It is constant in nature and she has been getting frequent headaches.  She endorses changes in her vision at times secondary to these headaches.  She feels as though she also has pain in her mid and low back.  At times her mid back pain radiates into the sides of her ribs.  Concern for cervical radiculopathy and cervicogenic headaches secondary to arthritis.  Plan includes the following moving forward:  -X-rays today with extension and flexion of her cervical spine.  Would also like to evaluate thoracic spine given radiating rib pain.  - MRI of cervical spine was placed.  Will review results once complete.  Would like to evaluate for stenosis.  We also consider MRI of thoracic spine in the future or brain imaging considering vision changes. - Physical therapy referral was placed. -Zanaflex  given for pain.  Plan to see back in approximately 8 weeks.    Thank you for involving me in the care of this patient.    Lyle Decamp, PA-C Dept. of Neurosurgery

## 2024-01-24 ENCOUNTER — Inpatient Hospital Stay
Admission: RE | Admit: 2024-01-24 | Discharge: 2024-01-24 | Disposition: A | Payer: Self-pay | Source: Ambulatory Visit | Attending: Physician Assistant | Admitting: Physician Assistant

## 2024-01-24 ENCOUNTER — Other Ambulatory Visit: Payer: Self-pay

## 2024-01-24 ENCOUNTER — Inpatient Hospital Stay
Admission: RE | Admit: 2024-01-24 | Discharge: 2024-01-24 | Disposition: A | Discharge status: Home / Self Care | Payer: Self-pay | Source: Ambulatory Visit | Attending: Physician Assistant | Admitting: Physician Assistant

## 2024-01-24 DIAGNOSIS — Z049 Encounter for examination and observation for unspecified reason: Secondary | ICD-10-CM

## 2024-01-25 ENCOUNTER — Encounter: Payer: Self-pay | Admitting: Physician Assistant

## 2024-01-25 ENCOUNTER — Ambulatory Visit

## 2024-01-25 ENCOUNTER — Ambulatory Visit: Admitting: Physician Assistant

## 2024-01-25 VITALS — BP 114/62 | Ht 66.5 in | Wt 190.4 lb

## 2024-01-25 DIAGNOSIS — M542 Cervicalgia: Secondary | ICD-10-CM

## 2024-01-25 DIAGNOSIS — M549 Dorsalgia, unspecified: Secondary | ICD-10-CM

## 2024-01-25 DIAGNOSIS — M50321 Other cervical disc degeneration at C4-C5 level: Secondary | ICD-10-CM | POA: Diagnosis not present

## 2024-01-25 MED ORDER — TIZANIDINE HCL 4 MG PO TABS: 4.0000 mg | ORAL_TABLET | Freq: Three times a day (TID) | ORAL | 1 refills | Status: DC

## 2024-01-26 ENCOUNTER — Inpatient Hospital Stay
Admission: RE | Admit: 2024-01-26 | Discharge: 2024-01-26 | Attending: Physician Assistant | Admitting: Physician Assistant

## 2024-01-26 DIAGNOSIS — M5020 Other cervical disc displacement, unspecified cervical region: Secondary | ICD-10-CM | POA: Diagnosis not present

## 2024-01-26 DIAGNOSIS — M542 Cervicalgia: Secondary | ICD-10-CM

## 2024-01-26 DIAGNOSIS — M4802 Spinal stenosis, cervical region: Secondary | ICD-10-CM | POA: Diagnosis not present

## 2024-01-31 ENCOUNTER — Other Ambulatory Visit: Payer: Self-pay | Admitting: Physician Assistant

## 2024-01-31 ENCOUNTER — Ambulatory Visit: Payer: Self-pay | Admitting: Physician Assistant

## 2024-01-31 DIAGNOSIS — E041 Nontoxic single thyroid nodule: Secondary | ICD-10-CM

## 2024-02-03 ENCOUNTER — Ambulatory Visit
Admission: RE | Admit: 2024-02-03 | Discharge: 2024-02-03 | Attending: Physician Assistant | Admitting: Physician Assistant

## 2024-02-03 DIAGNOSIS — E042 Nontoxic multinodular goiter: Secondary | ICD-10-CM | POA: Diagnosis not present

## 2024-02-03 DIAGNOSIS — E041 Nontoxic single thyroid nodule: Secondary | ICD-10-CM | POA: Insufficient documentation

## 2024-02-07 ENCOUNTER — Ambulatory Visit: Payer: Self-pay | Admitting: Physician Assistant

## 2024-02-07 ENCOUNTER — Other Ambulatory Visit: Payer: Self-pay | Admitting: Physician Assistant

## 2024-02-07 DIAGNOSIS — E041 Nontoxic single thyroid nodule: Secondary | ICD-10-CM

## 2024-02-08 NOTE — Progress Notes (Signed)
 Patient for US  guided LT Thyroid  Nodule Biopsy on Wed 02/09/24, I called and spoke with the patient on the phone and gave pre-procedure instructions. Pt was made aware to be here at 2p and check in at the [facility] registration desk. Pt stated understanding.  Called 02/08/24

## 2024-02-09 ENCOUNTER — Ambulatory Visit
Admission: RE | Admit: 2024-02-09 | Discharge: 2024-02-09 | Disposition: A | Source: Ambulatory Visit | Attending: Physician Assistant | Admitting: Physician Assistant

## 2024-02-09 VITALS — HR 91 | Resp 18

## 2024-02-09 DIAGNOSIS — E041 Nontoxic single thyroid nodule: Secondary | ICD-10-CM | POA: Diagnosis not present

## 2024-02-09 MED ORDER — LIDOCAINE HCL (PF) 1 % IJ SOLN
10.0000 mL | Freq: Once | INTRAMUSCULAR | Status: AC
  Administered 2024-02-09: 10 mL via INTRADERMAL
  Filled 2024-02-09: qty 10

## 2024-02-10 ENCOUNTER — Other Ambulatory Visit: Payer: Self-pay | Admitting: Physician Assistant

## 2024-02-10 ENCOUNTER — Ambulatory Visit: Payer: Self-pay | Admitting: Physician Assistant

## 2024-02-10 DIAGNOSIS — E041 Nontoxic single thyroid nodule: Secondary | ICD-10-CM

## 2024-02-10 LAB — CYTOLOGY - NON PAP

## 2024-02-17 ENCOUNTER — Other Ambulatory Visit: Payer: Self-pay | Admitting: Physician Assistant

## 2024-02-17 DIAGNOSIS — M545 Low back pain, unspecified: Secondary | ICD-10-CM | POA: Diagnosis not present

## 2024-02-17 DIAGNOSIS — M542 Cervicalgia: Secondary | ICD-10-CM | POA: Diagnosis not present

## 2024-02-17 DIAGNOSIS — R293 Abnormal posture: Secondary | ICD-10-CM | POA: Diagnosis not present

## 2024-02-21 DIAGNOSIS — M545 Low back pain, unspecified: Secondary | ICD-10-CM | POA: Diagnosis not present

## 2024-02-21 DIAGNOSIS — R293 Abnormal posture: Secondary | ICD-10-CM | POA: Diagnosis not present

## 2024-02-21 DIAGNOSIS — M542 Cervicalgia: Secondary | ICD-10-CM | POA: Diagnosis not present

## 2024-02-22 DIAGNOSIS — M545 Low back pain, unspecified: Secondary | ICD-10-CM | POA: Diagnosis not present

## 2024-02-22 DIAGNOSIS — R293 Abnormal posture: Secondary | ICD-10-CM | POA: Diagnosis not present

## 2024-02-22 DIAGNOSIS — M542 Cervicalgia: Secondary | ICD-10-CM | POA: Diagnosis not present

## 2024-02-28 DIAGNOSIS — M542 Cervicalgia: Secondary | ICD-10-CM | POA: Diagnosis not present

## 2024-02-28 DIAGNOSIS — M545 Low back pain, unspecified: Secondary | ICD-10-CM | POA: Diagnosis not present

## 2024-02-28 DIAGNOSIS — R293 Abnormal posture: Secondary | ICD-10-CM | POA: Diagnosis not present

## 2024-03-01 DIAGNOSIS — Z6831 Body mass index (BMI) 31.0-31.9, adult: Secondary | ICD-10-CM | POA: Diagnosis not present

## 2024-03-01 DIAGNOSIS — E6609 Other obesity due to excess calories: Secondary | ICD-10-CM | POA: Diagnosis not present

## 2024-03-01 DIAGNOSIS — M545 Low back pain, unspecified: Secondary | ICD-10-CM | POA: Diagnosis not present

## 2024-03-01 DIAGNOSIS — E042 Nontoxic multinodular goiter: Secondary | ICD-10-CM | POA: Diagnosis not present

## 2024-03-01 DIAGNOSIS — M542 Cervicalgia: Secondary | ICD-10-CM | POA: Diagnosis not present

## 2024-03-01 DIAGNOSIS — R7303 Prediabetes: Secondary | ICD-10-CM | POA: Diagnosis not present

## 2024-03-01 DIAGNOSIS — R293 Abnormal posture: Secondary | ICD-10-CM | POA: Diagnosis not present
